# Patient Record
Sex: Female | Born: 1960 | Race: White | Hispanic: No | Marital: Single | Smoking: Former smoker
Health system: Southern US, Community
[De-identification: ages and names within clinical notes are randomized; demographics above are authoritative.]

## PROBLEM LIST (undated history)

## (undated) DIAGNOSIS — F17200 Nicotine dependence, unspecified, uncomplicated: Secondary | ICD-10-CM

## (undated) DIAGNOSIS — N83202 Unspecified ovarian cyst, left side: Secondary | ICD-10-CM

## (undated) DIAGNOSIS — D071 Carcinoma in situ of vulva: Secondary | ICD-10-CM

## (undated) HISTORY — DX: Nicotine dependence, unspecified, uncomplicated: F17.200

## (undated) HISTORY — DX: Unspecified ovarian cyst, left side: N83.202

## (undated) HISTORY — DX: Carcinoma in situ of vulva: D07.1

---
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
## 2005-10-08 ENCOUNTER — Encounter: Payer: Self-pay | Admitting: Family Medicine

## 2005-10-08 LAB — CONVERTED CEMR LAB: Pap Smear: NORMAL

## 2007-02-21 ENCOUNTER — Ambulatory Visit: Payer: Self-pay | Admitting: Family Medicine

## 2007-02-21 DIAGNOSIS — F17200 Nicotine dependence, unspecified, uncomplicated: Secondary | ICD-10-CM | POA: Insufficient documentation

## 2007-02-28 ENCOUNTER — Ambulatory Visit: Payer: Self-pay | Admitting: Family Medicine

## 2007-02-28 DIAGNOSIS — N3000 Acute cystitis without hematuria: Secondary | ICD-10-CM | POA: Insufficient documentation

## 2007-02-28 LAB — CONVERTED CEMR LAB
Glucose, Urine, Semiquant: NEGATIVE
Nitrite: POSITIVE
Protein, U semiquant: 30
Specific Gravity, Urine: 1.025
pH: 6.5

## 2007-03-07 ENCOUNTER — Encounter: Payer: Self-pay | Admitting: Family Medicine

## 2007-03-07 ENCOUNTER — Telehealth: Payer: Self-pay | Admitting: Family Medicine

## 2007-03-07 LAB — CONVERTED CEMR LAB
Bilirubin Urine: NEGATIVE
Glucose, Urine, Semiquant: NEGATIVE
Nitrite: NEGATIVE
Protein, U semiquant: 100
Specific Gravity, Urine: 1.025
Urobilinogen, UA: NEGATIVE
pH: 5.5

## 2007-05-16 ENCOUNTER — Ambulatory Visit: Payer: Self-pay | Admitting: Family Medicine

## 2007-08-02 ENCOUNTER — Ambulatory Visit: Payer: Self-pay | Admitting: Family Medicine

## 2007-09-22 HISTORY — PX: TUBAL LIGATION: SHX77

## 2007-10-04 ENCOUNTER — Ambulatory Visit: Payer: Self-pay | Admitting: Family Medicine

## 2007-10-04 LAB — CONVERTED CEMR LAB
Bilirubin Urine: NEGATIVE
Nitrite: NEGATIVE
Protein, U semiquant: NEGATIVE
Urobilinogen, UA: 0.2

## 2007-10-18 ENCOUNTER — Ambulatory Visit: Payer: Self-pay | Admitting: Family Medicine

## 2007-10-19 ENCOUNTER — Encounter: Payer: Self-pay | Admitting: Family Medicine

## 2008-01-13 ENCOUNTER — Ambulatory Visit: Payer: Self-pay | Admitting: Family Medicine

## 2008-03-30 ENCOUNTER — Encounter: Payer: Self-pay | Admitting: Family Medicine

## 2008-03-30 ENCOUNTER — Ambulatory Visit: Payer: Self-pay | Admitting: Family Medicine

## 2008-03-30 ENCOUNTER — Other Ambulatory Visit: Admission: RE | Admit: 2008-03-30 | Discharge: 2008-03-30 | Payer: Self-pay | Admitting: Family Medicine

## 2008-03-30 DIAGNOSIS — R5383 Other fatigue: Secondary | ICD-10-CM

## 2008-03-30 DIAGNOSIS — R5381 Other malaise: Secondary | ICD-10-CM

## 2008-04-18 ENCOUNTER — Ambulatory Visit: Payer: Self-pay | Admitting: Obstetrics & Gynecology

## 2008-05-02 ENCOUNTER — Encounter: Payer: Self-pay | Admitting: Family Medicine

## 2008-05-03 LAB — CONVERTED CEMR LAB
Albumin: 4.2 g/dL (ref 3.5–5.2)
Calcium: 9.1 mg/dL (ref 8.4–10.5)
Cholesterol, target level: 200 mg/dL
Cholesterol: 245 mg/dL — ABNORMAL HIGH (ref 0–200)
Creatinine, Ser: 0.71 mg/dL (ref 0.40–1.20)
HDL goal, serum: 40 mg/dL
HDL: 32 mg/dL — ABNORMAL LOW (ref >39)
LDL Cholesterol: 156 mg/dL — ABNORMAL HIGH (ref 0–99)
LDL Goal: 130 mg/dL
Potassium: 4 meq/L (ref 3.5–5.3)
TSH: 1.374 microintl units/mL (ref 0.350–4.50)
Total CHOL/HDL Ratio: 7.7
Triglycerides: 285 mg/dL — ABNORMAL HIGH (ref <150)
VLDL: 57 mg/dL — ABNORMAL HIGH (ref 0–40)

## 2008-05-25 ENCOUNTER — Ambulatory Visit: Payer: Self-pay | Admitting: Obstetrics & Gynecology

## 2008-05-25 ENCOUNTER — Ambulatory Visit (HOSPITAL_COMMUNITY): Admission: RE | Admit: 2008-05-25 | Discharge: 2008-05-25 | Payer: Self-pay | Admitting: Obstetrics & Gynecology

## 2008-05-30 ENCOUNTER — Ambulatory Visit: Payer: Self-pay | Admitting: Family Medicine

## 2008-05-30 DIAGNOSIS — E119 Type 2 diabetes mellitus without complications: Secondary | ICD-10-CM | POA: Insufficient documentation

## 2008-05-30 DIAGNOSIS — E785 Hyperlipidemia, unspecified: Secondary | ICD-10-CM

## 2008-05-30 DIAGNOSIS — R7301 Impaired fasting glucose: Secondary | ICD-10-CM

## 2008-06-06 ENCOUNTER — Ambulatory Visit: Payer: Self-pay | Admitting: Obstetrics & Gynecology

## 2008-06-27 ENCOUNTER — Ambulatory Visit: Payer: Self-pay | Admitting: Obstetrics & Gynecology

## 2008-08-14 ENCOUNTER — Telehealth (INDEPENDENT_AMBULATORY_CARE_PROVIDER_SITE_OTHER): Payer: Self-pay | Admitting: *Deleted

## 2010-05-15 ENCOUNTER — Ambulatory Visit: Payer: Self-pay | Admitting: Family Medicine

## 2010-05-15 ENCOUNTER — Encounter: Admission: RE | Admit: 2010-05-15 | Discharge: 2010-05-15 | Payer: Self-pay | Admitting: Family Medicine

## 2010-05-15 DIAGNOSIS — R1032 Left lower quadrant pain: Secondary | ICD-10-CM

## 2010-05-15 LAB — CONVERTED CEMR LAB
Bilirubin Urine: NEGATIVE
Specific Gravity, Urine: 1.02
Urobilinogen, UA: 0.2

## 2010-05-21 ENCOUNTER — Ambulatory Visit: Payer: Self-pay | Admitting: Family Medicine

## 2010-05-21 DIAGNOSIS — N83209 Unspecified ovarian cyst, unspecified side: Secondary | ICD-10-CM

## 2010-10-13 ENCOUNTER — Encounter: Payer: Self-pay | Admitting: Obstetrics & Gynecology

## 2010-10-21 NOTE — Assessment & Plan Note (Signed)
Summary: f/u ovarian cyst   Vital Signs:  Patient profile:   50 year old female Height:      62 inches Weight:      128 pounds BMI:     23.50 O2 Sat:      97 % on Room air Temp:     98.9 degrees F oral Pulse rate:   97 / minute BP sitting:   129 / 82  (left arm) Cuff size:   large  Vitals Entered By: Payton Spark CMA (May 21, 2010 3:43 PM)  O2 Flow:  Room air CC: 1 week f/u. Feeling much better.   Primary Care Provider:  Seymour Bars D.O.  CC:  1 week f/u. Feeling much better.Marland Kitchen  History of Present Illness: 50 yo WF presents f/u LLQ pain which turned out to be an ovarian cyst on a CT scan of the abdomen and pelvis.  She is feeling much better.  She is having regular periods.  She is mid cycle now.  She is not taking anything OTC.  Bowels normal.  No problems voiding.  Current Medications (verified): 1)  Promethazine-Codeine 6.25-10 Mg/78ml Syrp (Promethazine-Codeine) .... 5-10 Ml By Mouth Q 6 Hrs As Needed Nausea  Allergies (verified): 1)  ! Pcn 2)  ! * Td 3)  ! Darvocet  Past History:  Past Medical History: smoking L ovarian cyst 8-11  Past Surgical History: Reviewed history from 05/15/2010 and no changes required. BTL 2009  Social History: Reviewed history from 02/21/2007 and no changes required. 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  Lives with boyfriend and stepson.  No kids. G1P0. Smoked 1 ppd x 25 yrs, trying to quit.  Eats healthy and exercises regularly. Occasional ETOH.  Review of Systems      See HPI  Physical Exam  General:  alert, well-developed, well-nourished, and well-hydrated.   Head:  normocephalic and atraumatic.   Lungs:  Normal respiratory effort, chest expands symmetrically. Lungs are clear to auscultation, no crackles or wheezes. Heart:  Normal rate and regular rhythm. S1 and S2 normal without gallop, murmur, click, rub or other extra sounds. Abdomen:  Bowel sounds positive,abdomen soft and non-tender without masses, organomegaly    Skin:  color normal.     Impression & Recommendations:  Problem # 1:  OVARIAN CYST, LEFT (ICD-620.2) Assessment Improved LLQ pain confirmed to be a L resolving ovarian cyst on CT scan (o/w neg).   Pain has resolved completely and she is doing great. On schedule for her first colonoscopy at 51.  Complete Medication List: 1)  Promethazine-codeine 6.25-10 Mg/84ml Syrp (Promethazine-codeine) .... 5-10 ml by mouth q 6 hrs as needed nausea

## 2010-10-21 NOTE — Assessment & Plan Note (Signed)
Summary: LLQ pain   Vital Signs:  Patient profile:   50 year old female Height:      62 inches Weight:      126 pounds BMI:     23.13 O2 Sat:      98 % on Room air Temp:     98.9 degrees F oral Pulse rate:   93 / minute BP sitting:   127 / 81  (left arm) Cuff size:   large  Vitals Entered By: Payton Spark CMA (May 15, 2010 8:13 AM)  O2 Flow:  Room air CC: LLQ pain radiates around and to lower back.    Primary Care Provider:  Seymour Bars D.O.  CC:  LLQ pain radiates around and to lower back. .  History of Present Illness: 50 yo WF presents for LLQ pain that began at 0200 Sunday AM.  The pain started periumbilically and also a sharp pain in the LLQ.  It was constant.  She took some Tylenol.  She had rode her horse the day prior.  It did not improve after Tylenol.  She had normal BMs w/ o bleeding.  She has had a little less appetite.  she had nausea last night.  No v/d.  Had chills on and off.    She had a BTL 2 yrs ago.  She is having regular periods.     Current Medications (verified): 1)  None  Allergies (verified): 1)  ! Pcn 2)  ! * Td 3)  ! Darvocet  Past History:  Past Medical History: Reviewed history from 02/21/2007 and no changes required. smoking  Past Surgical History: BTL 2009  Family History: Reviewed history from 03/30/2008 and no changes required. father AMI at 61, HTN, high cholestrol mother healthy brother high chol, hypothyroid GF colorectal CA at 4.  Social History: Reviewed history from 02/21/2007 and no changes required. 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  No kids. G1P0. Smoked 1 ppd x 25 yrs, trying to quit.  Eats healthy and exercises regularly. Occasional ETOH.  Review of Systems      See HPI  Physical Exam  General:  alert, well-developed, well-nourished, and well-hydrated.   Head:  normocephalic and atraumatic.   Eyes:  sclera non icteric Mouth:  pharynx pink and moist.   Neck:   no masses.   Lungs:  Normal respiratory effort, chest expands symmetrically. Lungs are clear to auscultation, no crackles or wheezes. Heart:  Normal rate and regular rhythm. S1 and S2 normal without gallop, murmur, click, rub or other extra sounds. Abdomen:  LLQ TTP with vol guarding.  soft.  ND.  NABS.  No HSM Extremities:  no LE edema Skin:  color normal.  no pallor or jaundice Cervical Nodes:  No lymphadenopathy noted   Impression & Recommendations:  Problem # 1:  ABDOMINAL PAIN, LEFT LOWER QUADRANT (ICD-789.04) UA + for blood but neg for sign of infection with LLQ pain.  DDX includes diverticulitis vs nephrolithiasis vs ovarian cyst.  Not high risk for adhesions given only abd surgery :BTL.  Unlikely tubal pregnancy given BTL hx.   Will get CT abd/ pelvis today with labs to look for cause.  No sign of actue abdomen.  Hemodynamically stable.   Treat pain/ nausea with RX phentergan with codeine.  Hold off on filling abx until CT confirms if this is truly diverticulitis.   Orders: T-CBC w/Diff ([id number]) T-Comprehensive Metabolic Panel [phone]) T-CT Abdomen/pelvis w ([id number]) UA Dipstick w/o Micro (  automated)  (81003)  Complete Medication List: 1)  Metronidazole 500 Mg Tabs (Metronidazole) .Marland Kitchen.. 1 tab by mouth three times a day x 10 days 2)  Ciprofloxacin Hcl 500 Mg Tabs (Ciprofloxacin hcl) .Marland Kitchen.. 1 tab by mouth two times a day x 10 days 3)  Promethazine-codeine 6.25-10 Mg/5ml Syrp (Promethazine-codeine) .... 5-10 ml by mouth q 6 hrs as needed nausea  Patient Instructions: 1)  Labs first today then go down the hall to imaging for CT scan. 2)  Will call you w/ results this afternoon. 3)  Clear liquid diet x 48 hrs. 4)  Use Promethazine with Codeine for nausea/ abdominal pain as needed.   5)  Return for follow up on WED. Prescriptions: PROMETHAZINE-CODEINE 6.25-10 MG/5ML SYRP (PROMETHAZINE-CODEINE) 5-10 ml by mouth q 6 hrs as needed nausea  #120 ml x 0   Entered and Authorized by:    Seymour Bars DO   Signed by:   Seymour Bars DO on 05/15/2010   Method used:   Printed then faxed to ...       [facility]* (retail)       [address] [place], [place]  [address]       Ph: [phone]       Fax: [phone]   RxID:   [id number] CIPROFLOXACIN HCL 500 MG TABS (CIPROFLOXACIN HCL) 1 tab by mouth two times a day x 10 days  #20 x 0   Entered and Authorized by:   Seymour Bars DO   Signed by:   Seymour Bars DO on 05/15/2010   Method used:   Print then Give to Patient   RxID:   [id number] METRONIDAZOLE 500 MG TABS (METRONIDAZOLE) 1 tab by mouth three times a day x 10 days  #30 x 0   Entered and Authorized by:   Seymour Bars DO   Signed by:   Seymour Bars DO on 05/15/2010   Method used:   Print then Give to Patient   RxID:   [id number]   Laboratory Results   Urine Tests    Routine Urinalysis   Color: lt. yellow Appearance: Clear Glucose: negative   (Normal Range: Negative) Bilirubin: negative   (Normal Range: Negative) Ketone: negative   (Normal Range: Negative) Spec. Gravity: 1.020   (Normal Range: 1.003-1.035) Blood: large   (Normal Range: Negative) pH: 7.0   (Normal Range: 5.0-8.0) Protein: negative   (Normal Range: Negative) Urobilinogen: 0.2   (Normal Range: 0-1) Nitrite: negative   (Normal Range: Negative) Leukocyte Esterace: trace   (Normal Range: Negative)

## 2011-02-03 NOTE — Assessment & Plan Note (Signed)
NAMEARLETT, GOOLD                 ACCOUNT NO.:  [id number]   MEDICAL RECORD NO.:  [id number]          PATIENT TYPE:  POB   LOCATION:  [facility] [place]         FACILITY:  [facility]   PHYSICIAN:  Allie Bossier, MD        DATE OF BIRTH:  January 13, 1961   DATE OF SERVICE:                                  CLINIC NOTE   Kelli Frank is a 50 year old single white gravida 2, para 0, elective AB 2,  who is helping raise her boyfriend's kids.  In fact, he has a 14-year-  old son and a 31 year old son.  The 31 year old son lives with Kelli Frank and  her boyfriend.  They have been monogamous for 5 years.  She is certain  that she does not want children ever.  She used oral contraceptive pills  in her 33s, then she was abstinent for another 10 plus years, and she  has been on Depo for the last 3 years.  She is amenorrheic, but she has  had a 20-pound weight gain in the last 2 years and is concerned about  the risks of osteoporosis.  She wishes to have permanent sterility in  the form of the tubal ligation.  We discussed the Essure procedure, but  due to the followup HSC that is necessary, she opts for the tubal  ligation.   PAST MEDICAL HISTORY:  None.   PAST SURGICAL HISTORY:  Tonsillectomy and adenoidectomy as a child.   FAMILY HISTORY:  Negative for breast, GYN, and colon malignancies.   SOCIAL HISTORY:  She is currently on Chantix, trying to stop smoking,  but she has a 25-pack year history of smoking.  She drinks alcohol very  rarely and denies any drug use.   ALLERGIES:  No Latex allergies.  Medicine allergy is TETANUS,  PENICILLIN, and DARVOCET.   REVIEW OF SYSTEMS:  She had a Pap smear in July that was normal.  Her  mammogram is scheduled.  She is self-scheduled to get her TSH drawn  along with routine blood work from her family doctor.  She currently  denies dyspareunia.  She is a Oceanographer.  She had occasional hot  flashes and night sweats over the last 2 years.   MEDICATIONS:  1. Chantix  daily.  2. Vitamin E 400 units daily.  3. Vitamin C 1000 mg daily.  4. Fish oil 1000 mg daily.  5. Glucosamine 50 mg daily.  6. Depo-Provera 150 mg IM every 12 weeks.  Her last shot was March 30, 2008.  She takes calcium with vitamin D daily, and she takes      ibuprofen as necessary.   PHYSICAL EXAMINATION:  VITAL SIGNS:  Her weight is 144 pounds, her  height is 5 foot 2, blood pressure 109/76, and pulse 82.  HEENT:  Normal.  HEART:  Regular rate and rhythm.  LUNGS:  Clear to auscultation bilaterally.  PELVIC:  Within normal limits.   ASSESSMENT/PLAN:  Desires sterility.  We have discussed the various  alternatives for sterility, and she chooses tubal ligation (laparoscopic  Filshie clips).  I have explained 1% failure rate, as well as the  recovery time, and risks of surgery.  She wishes to proceed.      Allie Bossier, MD     MCD/MEDQ  D:  04/18/2008  T:  04/19/2008  Job:  [id number]

## 2011-02-03 NOTE — Op Note (Signed)
Kelli Frank, Kelli Frank                 ACCOUNT NO.:  [id number]   MEDICAL RECORD NO.:  [id number]          PATIENT TYPE:  AMB   LOCATION:  [facility]                           FACILITY:  [facility]   PHYSICIAN:  Allie Bossier, MD        DATE OF BIRTH:  Aug 29, 1961   DATE OF PROCEDURE:  DATE OF DISCHARGE:                               OPERATIVE REPORT   PREOPERATIVE DIAGNOSIS:  Desires sterility.   POSTOPERATIVE DIAGNOSES:  1. Desires sterility.  2. Adhesions.   PROCEDURE:  1. Laparoscopic application of Filshie clip.  2. Lysis of adhesions.   SURGEON:  Allie Bossier, MD   ANESTHESIA:  General anesthesia.   COMPLICATIONS:  None.   ESTIMATED BLOOD LOSS:  Minimal.   SPECIMENS:  None.   FINDINGS:  1. Dense adhesions of the omentum to the right anterior abdominal      wall.  2. Otherwise normal pelvic anatomy.   DETAILED PROCEDURE AND FINDINGS:  The risks, benefits, and alternatives  of the surgery were explained.  The risks were accepted.  Consents were  signed.  She understands 1% failure rate and declines alternative forms  of birth control.  She was taken to the operating room, placed in a  dorsal lithotomy position.  Once she was comfortable, gentle anesthesia  was applied without complication.  Her abdomen and vagina were prepped  and draped in a usual sterile fashion.  Her bladder was emptied with a  Robinson catheter.  Bimanual exam confirmed normal pelvic anatomy.  A  Hulka manipulator was placed.  Gloves were changed.  Attention was  turned to the abdomen.  A vertical umbilical incision was made.  A  Veress needle was placed intraperitoneally.  Low-flow CO2 was used to  insufflate the abdomen approximately 2 L.  The patient's abdominal  pressure was always less than 10.  A 12-mm trocar was placed.  Laparoscopy confirmed correct placement.  She was placed in  Trendelenburg position.  There were dense adhesions of the omentum to  the right anterior abdominal wall.  The oviducts were  visually traced in  the fimbriated end.  In the isthmic region of each tube, each oviduct  was traced to its fimbriated end.  A Filshie clip was placed in the  isthmic region of each tube across the entire oviduct in a perpendicular  fashion.  No bleeding was noted from these sites.  Because of the dense  adhesions, I put a 5-mm port in each lower quadrant using  electrosurgical technique down the adhesions.  No bleeding was noted.  Upon initial placement of the first Filshie clip on the patient's right  tube, the clip was not engaged properly and fell off.  I put in a 5-mm  port in each lower quadrant and retrieved the Filshie clip after both  tubes were properly clamped.  I then took on the adhesions with  electrosurgical technique.  Excellent hemostasis was maintained.  The 5-  mm ports were removed.  No bleeding was noted from the site.  The CO2  was allowed to escape from  the abdomen.  The 12-mm port was removed.  A  total of 10 mL of 0.5% Marcaine was injected in subcutaneous tissue with  the 3 incision sites.  A subcuticular closure was done with 4-0 Vicryl  at each site.  The Hulka manipulator was removed.  She was extubated and  taken to recovery room in stable condition.  Instrument, sponge, and  needles were counts.      Allie Bossier, MD  Electronically Signed     MCD/MEDQ  D:  05/25/2008  T:  05/26/2008  Job:  [id number]

## 2011-02-03 NOTE — Assessment & Plan Note (Signed)
Kelli Frank, Kelli Frank                 ACCOUNT NO.:  [id number]   MEDICAL RECORD NO.:  [id number]          PATIENT TYPE:  POB   LOCATION:  [facility] [place]         FACILITY:  [facility]   PHYSICIAN:  Elsie Lincoln, MD      DATE OF BIRTH:  01-16-1961   DATE OF SERVICE:                                  CLINIC NOTE   The patient is a 50 year old female who underwent laparoscopic tubal  ligation on May 25, 2008.  The patient had an uncomplicated  surgery.  There were some omental adhesions to anterior abdominal wall  that were resected down without issue.  There was no other pelvic  adhesive disease.  I did call Dr. Marice Potter today and confirmed that there  was nothing else that I should be aware of this woman's operative  report.  The patient reports severe pain in her lower quadrants near her  ports.  She complains of some decreased appetite.  No nausea, vomiting,  or diarrhea.  The pain is sometimes so severe that it keeps her from  walking, and this pain has only been there since this past Sunday.   PHYSICAL EXAMINATION:  VITAL SIGNS:  Temperature 97.6, pulse 94, blood  pressure 144/87, weight 144, and height 62 inches.  GENERAL:  Well nourished, well developed, in no apparent distress.  ABDOMEN:  Soft, tender over right and left lower quadrants at the  incisions, no rebound, and little bit of guarding.  BIMANUAL:  Positive cervical motion tenderness.   ASSESSMENT AND PLAN:  A 50 year old female 2 weeks' postop from a  bilateral tubal ligation with severe lower quadrant pain.  We will get;  1. CT with IV and p.o. contrast of the abdomen and pelvis to rule out      appendicitis or any postoperative complications.  2. Come back in 2 weeks to follow up pain.  3. If the CT shows pathology, we will call her and send to the      emergency room.           ______________________________  Elsie Lincoln, MD     KL/MEDQ  D:  06/06/2008  T:  06/07/2008  Job:  [id number]

## 2011-02-03 NOTE — Assessment & Plan Note (Signed)
Kelli Frank, Kelli Frank                 ACCOUNT NO.:  [id number]   MEDICAL RECORD NO.:  [id number]          PATIENT TYPE:  POB   LOCATION:  [facility] [place]         FACILITY:  [facility]   PHYSICIAN:  Allie Bossier, MD        DATE OF BIRTH:  Oct 13, 1960   DATE OF SERVICE:  06/27/2008                                  CLINIC NOTE   Ms. Ford is a 50 year old lady who is 6 weeks' postop status post  laparoscopic application of plastic cups for sterilization procedure and  lysis of adhesions from her right anterior abdominal wall (omental  adhesions).  Lerline had a somewhat difficult postop course.  She had some  right lower quadrant pain and in fact was seen here on June 06, 2008.  A pelvic exam was done, and a CT with IV and p.o. contrast of the  abdomen and pelvis was done to rule out appendicitis.  However, Desi's  pain resolved very rapidly after her exam, and she did not get her CT.  She has had no further problems.  She has had a small amount of bleeding  (as her Depo-Provera wears off).  Normal appetite, no bowel changes, and  no incisional problems.   PHYSICAL EXAMINATION:  SKIN:  Her incisions are all healed well.   ASSESSMENT AND PLAN:  Postoperative check, doing well.  Follow up in  July 2010 for annual exam.      Allie Bossier, MD     MCD/MEDQ  D:  06/27/2008  T:  06/27/2008  Job:  [phone]

## 2011-04-10 ENCOUNTER — Other Ambulatory Visit: Payer: Self-pay | Admitting: Family Medicine

## 2011-04-10 ENCOUNTER — Ambulatory Visit (INDEPENDENT_AMBULATORY_CARE_PROVIDER_SITE_OTHER): Payer: Self-pay | Admitting: Family Medicine

## 2011-04-10 ENCOUNTER — Ambulatory Visit: Payer: Self-pay | Admitting: Family Medicine

## 2011-04-10 ENCOUNTER — Telehealth: Payer: Self-pay | Admitting: Family Medicine

## 2011-04-10 VITALS — BP 137/88 | HR 82 | Temp 98.9°F

## 2011-04-10 DIAGNOSIS — N390 Urinary tract infection, site not specified: Secondary | ICD-10-CM

## 2011-04-10 LAB — POCT URINALYSIS DIPSTICK
Bilirubin, UA: NEGATIVE
Glucose, UA: NEGATIVE
Protein, UA: NEGATIVE
Spec Grav, UA: 1.02

## 2011-04-10 MED ORDER — CIPROFLOXACIN HCL 250 MG PO TABS: 250.0000 mg | ORAL_TABLET | Freq: Two times a day (BID) | ORAL | Status: DC

## 2011-04-10 NOTE — Telephone Encounter (Signed)
Pt aware of above.

## 2011-04-10 NOTE — Progress Notes (Signed)
  Subjective:    Patient ID: Kelli Frank, female    DOB: July 07, 1961, 50 y.o.   MRN: [id number]  HPI  Dysuria x 2 days.  Review of Systems     Objective:   Physical Exam        Assessment & Plan:  UA + for infection.  Will treat with Cipro 250 bid x 3 days.  Call if not resolved in 72 hrs.

## 2011-04-10 NOTE — Telephone Encounter (Signed)
Pls let pt know that her UA did look + for infection.  I sent her in Cipro to take 2 x a day for 3 days.  Avoid sex during treatment.  Drink plenty of water.  Call if symptoms have not resolved in 72 hrs.

## 2011-04-10 NOTE — Progress Notes (Signed)
  Subjective:    Patient ID: Kelli Frank, female    DOB: 03-16-1961, 50 y.o.   MRN: [id number]  HPI  Dysuria x 2 days with pressure.   Review of Systems     Objective:   Physical Exam        Assessment & Plan:  Dysuria- UA + for blood, trace LE.  Not menstruating.  Will start her on Cipro today.  Call if not improved in 3 days.

## 2011-04-19 ENCOUNTER — Encounter: Payer: Self-pay | Admitting: Family Medicine

## 2011-05-01 ENCOUNTER — Other Ambulatory Visit (HOSPITAL_COMMUNITY)
Admission: RE | Admit: 2011-05-01 | Discharge: 2011-05-01 | Disposition: A | Discharge status: Home / Self Care | Payer: BC Managed Care – PPO | Source: Ambulatory Visit | Attending: Family Medicine | Admitting: Family Medicine

## 2011-05-01 ENCOUNTER — Ambulatory Visit (INDEPENDENT_AMBULATORY_CARE_PROVIDER_SITE_OTHER): Payer: BC Managed Care – PPO | Admitting: Family Medicine

## 2011-05-01 ENCOUNTER — Encounter: Payer: Self-pay | Admitting: Family Medicine

## 2011-05-01 VITALS — BP 147/88 | HR 97 | Ht 61.0 in | Wt 127.0 lb

## 2011-05-01 DIAGNOSIS — Z130 Encounter for screening for diseases of the blood and blood-forming organs and certain disorders involving the immune mechanism: Secondary | ICD-10-CM

## 2011-05-01 DIAGNOSIS — Z1329 Encounter for screening for other suspected endocrine disorder: Secondary | ICD-10-CM

## 2011-05-01 DIAGNOSIS — Z13220 Encounter for screening for lipoid disorders: Secondary | ICD-10-CM

## 2011-05-01 DIAGNOSIS — Z01419 Encounter for gynecological examination (general) (routine) without abnormal findings: Secondary | ICD-10-CM | POA: Insufficient documentation

## 2011-05-01 DIAGNOSIS — Z1231 Encounter for screening mammogram for malignant neoplasm of breast: Secondary | ICD-10-CM

## 2011-05-01 MED ORDER — VARENICLINE TARTRATE 0.5 MG X 11 & 1 MG X 42 PO MISC: ORAL | Status: DC

## 2011-05-01 NOTE — Progress Notes (Signed)
  Subjective:    Patient ID: Kelli Frank, female    DOB: 09/06/1961, 50 y.o.   MRN: [id number]  HPI 50 yo WF presents for CPE with pap.  She is perimenopausal.  Ready to quit smoking. Wants to retry Chantix.  Monogamous relationship.  No vag discharge.  Not on birth control.  Has noticed a purplish white skin discoloration over the L perineum for about a month.  She thought it was from sex or riding her horse so she stopped both for 3 wks but it only grew in size.  No drainage and no pain.  Due for fasting labs and a mammogram.  BP 147/88  Pulse 97  Ht 5\' 1"  (1.549 m)  Wt 127 lb (57.607 kg)  BMI 24.00 kg/m2  SpO2 99%  LMP 04/20/2011   Review of Systems    Gen: no fevers, chills, hot flashes, night sweats, change in weight GI: no N/V/C/D GU: no dysuria, incontinence or sexual dysfunction CV: no chest pain, DOE, palpitations s or edema Pulm:  Denies CP, SOB or chronic cough  Objective:   Physical Exam  HENT:  Mouth/Throat: Oropharynx is clear and moist.  Eyes: No scleral icterus.  Neck: Neck supple. No thyromegaly present.  Cardiovascular: Normal rate, regular rhythm and normal heart sounds.   No murmur heard. Pulmonary/Chest: Effort normal and breath sounds normal. No respiratory distress. She has no wheezes. Right breast exhibits no mass, no nipple discharge, no skin change and no tenderness. Left breast exhibits no mass, no nipple discharge, no skin change and no tenderness.  Abdominal: Soft. She exhibits no distension. There is tenderness. There is no guarding.  Genitourinary: Uterus normal. Cervix exhibits no motion tenderness and no discharge. Right adnexum displays no mass, no tenderness and no fullness. Left adnexum displays no mass, no tenderness and no fullness. No erythema or tenderness around the vagina. Vaginal discharge found.  Musculoskeletal: She exhibits no edema.  Skin: Skin is warm and dry.       Purplish colored plaque - like lesion ~2 cm over the L perineal skin.    Psychiatric: She has a normal mood and affect.    Gen: alert, well groomed in NAD Neck: no thyromegaly or cervical lymphadenopathy CV: RRR w/o murmur, no audible carotid bruits or abdominal aortic bruits Ext: no edema, clubbing or cyanosis Lungs: CTA bilat w/o W/R/R; nonlabored HEENT:  [place]/AT; PERRLA; oropharynx pink and moist with good dentition Abd: soft, NT, ND, NABS, No HSM, no audible AA bruits Skin: warm and dry; no rash, pallor or jaundice Psych: does not appear anxious or depressed; answers questions appropriately      Assessment & Plan:  Assesment:  1. CPE- Keeping healthy checklist for women reviewed today.  BP elevated for the first time today.    BMI 24= normal. Labs ordered Colonoscopy due at 51. Last pap/- today Mammogram- ordered.  Allergic to tetanus. Encouraged healthy diet, regular exercise, MVI daily. Return for next physical in 1 yr.   Refer to gyn urgently to biopsy this purplish colored skin plaque on perineum worrisome for SCC.

## 2011-05-01 NOTE — Patient Instructions (Signed)
Update fasting labs one  Morning downstairs.  Will call you w/ results and with pap results.  Will set up biopsy of skin lesion.  Call [phone] and ask for [facility] to set up mammogram downstairs.  Chantix RX sent.  Return for f/u as needed.

## 2011-05-06 ENCOUNTER — Encounter: Payer: Self-pay | Admitting: *Deleted

## 2011-05-07 ENCOUNTER — Other Ambulatory Visit: Payer: Self-pay | Admitting: *Deleted

## 2011-05-07 MED ORDER — FESOTERODINE FUMARATE ER 4 MG PO TB24: 4.0000 mg | ORAL_TABLET | Freq: Every day | ORAL | Status: DC

## 2011-05-08 ENCOUNTER — Telehealth: Payer: Self-pay | Admitting: Family Medicine

## 2011-05-08 NOTE — Telephone Encounter (Signed)
Pls let pt know that her pap smear came back normal.

## 2011-05-08 NOTE — Telephone Encounter (Signed)
Pt aware.

## 2011-05-11 ENCOUNTER — Ambulatory Visit (INDEPENDENT_AMBULATORY_CARE_PROVIDER_SITE_OTHER): Payer: BC Managed Care – PPO | Admitting: Physician Assistant

## 2011-05-11 VITALS — BP 126/81 | HR 91 | Resp 16 | Ht 61.0 in | Wt 130.0 lb

## 2011-05-11 DIAGNOSIS — N898 Other specified noninflammatory disorders of vagina: Secondary | ICD-10-CM

## 2011-05-11 MED ORDER — LIDOCAINE 5 % EX OINT: TOPICAL_OINTMENT | CUTANEOUS | Status: DC | PRN

## 2011-05-12 ENCOUNTER — Ambulatory Visit
Admission: RE | Admit: 2011-05-12 | Discharge: 2011-05-12 | Disposition: A | Discharge status: Home / Self Care | Payer: BC Managed Care – PPO | Source: Ambulatory Visit | Attending: Family Medicine | Admitting: Family Medicine

## 2011-05-12 ENCOUNTER — Telehealth: Payer: Self-pay | Admitting: Family Medicine

## 2011-05-12 DIAGNOSIS — Z1231 Encounter for screening mammogram for malignant neoplasm of breast: Secondary | ICD-10-CM

## 2011-05-12 LAB — COMPLETE METABOLIC PANEL WITH GFR
ALT: <8 U/L (ref 0–35)
Albumin: 4.2 g/dL (ref 3.5–5.2)
Alkaline Phosphatase: 82 U/L (ref 39–117)
CO2: 25 mEq/L (ref 19–32)
Chloride: 108 mEq/L (ref 96–112)
GFR, Est African American: >60 mL/min (ref >60)
Glucose, Bld: 88 mg/dL (ref 70–99)
Potassium: 4.4 mEq/L (ref 3.5–5.3)
Sodium: 143 mEq/L (ref 135–145)
Total Bilirubin: 0.3 mg/dL (ref 0.3–1.2)
Total Protein: 6.5 g/dL (ref 6.0–8.3)

## 2011-05-12 LAB — LIPID PANEL
HDL: 44 mg/dL (ref >39)
VLDL: 22 mg/dL (ref 0–40)

## 2011-05-12 NOTE — Progress Notes (Signed)
  Subjective:    Patient ID: Kelli Frank, female    DOB: Jan 27, 1961, 50 y.o.   MRN: [id number]  HPI  Pt is a 50yo WF who presents with report of vulvar lesion first noted by husband ~ 3months ago. States that the original lesion was <1cm in size and noted to the left of the introitus and tan in color. Reports over the last 3 months the lesion has increased in size and the color has changed to purple/black/bruised color. Denies irritation or discomfort with ADLs or intercourse.  Review of Systems Neg other than what has been reviewed in the HPI    Objective:   Physical Exam  BP 126/81  Pulse 91  Resp 16  Ht 5\' 1"  (1.549 m)  Wt 130 lb (58.968 kg)  BMI 24.56 kg/m2  LMP 04/22/2011 Ple[place] WF, NAD Genitalia: irregular shaped lesion noted at 4 o'clock at the introitus, lateral lesion slightly raised and white in color, distal portion noted to be flat, purplish in color with irregular borders.  Procedure: Informed consent received and timeout preformed the area was infused with 1.5cc of 1% lidocaine. After approriate anesthesic block was established, 2 punch bx were performed in normal fashion in the above described areas in the distal and lateral lesion. Specimens were obtained and placed in formulin, sent to pathology.     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

## 2011-05-12 NOTE — Telephone Encounter (Signed)
Pls let pt know that her fasting sugar, liver and kidney function came back normal.  Her cholesterol is still a bit high with an LDL bad chol of 158 (unchanged from last year).  If she continues to smoke, we definitely need to get her to <130.  If she is unable to quit in the next 2 mos, will go ahead and start her on medication.  Let me know.

## 2011-05-13 NOTE — Telephone Encounter (Signed)
Pt aware.

## 2011-05-14 NOTE — Progress Notes (Signed)
Addendum: Pt is scheduled with Dr. Valentino Nose 05/28/11 at 9:00 in the [facility] center [facility].  She is to see Maylon Cos on 05/18/11 to discuss test results.

## 2011-05-18 ENCOUNTER — Encounter: Payer: Self-pay | Admitting: Physician Assistant

## 2011-05-18 ENCOUNTER — Ambulatory Visit: Payer: BC Managed Care – PPO | Admitting: Physician Assistant

## 2011-05-18 VITALS — BP 137/86 | HR 97 | Resp 17 | Ht 61.0 in | Wt 130.0 lb

## 2011-05-18 DIAGNOSIS — E8881 Metabolic syndrome: Secondary | ICD-10-CM

## 2011-05-28 ENCOUNTER — Ambulatory Visit: Payer: BC Managed Care – PPO | Attending: Gynecologic Oncology | Admitting: Gynecologic Oncology

## 2011-05-28 DIAGNOSIS — Z79899 Other long term (current) drug therapy: Secondary | ICD-10-CM | POA: Insufficient documentation

## 2011-05-28 DIAGNOSIS — D071 Carcinoma in situ of vulva: Secondary | ICD-10-CM | POA: Insufficient documentation

## 2011-05-29 NOTE — Consult Note (Signed)
Kelli Frank, Kelli Frank NO.:  [id number]  MEDICAL RECORD NO.:  [id number]  LOCATION:  GYN                          FACILITY:  [facility]  PHYSICIAN:  Kelli Schimke, MD     DATE OF BIRTH:  [birth year]  DATE OF CONSULTATION:  05/28/2011 DATE OF DISCHARGE:                                CONSULTATION   REASON FOR VISIT:  Consult was requested by Dr. Elsie Frank for  management of vulvar intraepithelial neoplasia, grade 3.  HISTORY OF PRESENT ILLNESS:  This is a 50 year old gravida 2, para 0, last normal menstrual period approximately 3 weeks ago.  The patient and her partner first observed the lesion approximately 6 months ago that appeared like a bruise, it has increased in size over the interim.  She presented to Dr. Penne Frank and a biopsy was obtained of the lesion. Lesion per the patient, was in the inferior-most aspect of the genitalia.  Biopsies at left inner 1 o'clock and left inner 6 o'clock returned with VIN-3.  Kelli Frank reports pruritus in the past, but none currently.  PAST MEDICAL HISTORY:  Denies.  PAST SURGICAL HISTORY:  Tonsillectomy and adenoidectomy at age 57, bilateral tubal ligation 3 years ago.  PAST GYN HISTORY:  Menarche occurred at the age of 77 with regular menses since.  She reports fibroid present for numerous years, confirmed by ultrasound.  She also reports mid-cycle intermittent left lower quadrant pain.  She denies any history of abnormal Pap test, the last Pap on May 01, 2011, was negative for intraepithelial lesion or malignancy.  Mammogram in 2012 was within normal limits.  ALLERGIES:  PENICILLIN causes hives and swelling.  TETANUS SHOT causes hives and swelling.  DARVOCET causes hives and swelling.  MEDICATIONS: 1. Kelli Frank 4 mg daily. 2. Kelli Frank, begun May 24, 2011. 3. Kelli Frank 2000 mg p.o. daily.  SOCIAL HISTORY:  Tobacco use, one-third Frank per day since the age of 9.  Alcohol use for 4 years.  The patient  is employed as a Warden/ranger.  She has been in a stable relationship for approximately 7-1/2 years.  REVIEW OF SYSTEMS:  No nausea, vomiting, shortness of breath, cough, hemoptysis.  No adenopathy, fever, chills, abdominal pain, nausea, vomiting, diarrhea.  Denies hematocolpos, hematuria, or hematochezia. She reports stress urinary incontinence.  Otherwise 10-point review of systems is negative.  PHYSICAL EXAMINATION:  VITAL SIGNS:  Weight 126 pounds, height 5 feet 1 inch, blood pressure 116/80, pulse of 64, respiratory rate 16, temperature 98.7. CHEST:  Clear to auscultation. HEART:  Regular rate and rhythm.  Normal S1, S2. LYMPH NODES SURVEY:  No cervical, subclavicular or inguinal adenopathy. BACK:  No CVA tenderness. ABDOMEN:  Soft, nontender, non-obese. PELVIC EXAMINATION:  Normal Bartholin's, urethra, and Skene's. Approximately 4 cm lesion noted on the perineum, close proximity to the anus.  No other lesions were noted on the vulva.  Pelvic examination is notable also for approximately 8 cm posterior leiomyoma.  There is no cul-de-sac nodularity. RECTAL EXAMINATION:  Good anal sphincter tone.  Posterior uterine mass appreciated. EXTREMITIES:  No clubbing, cyanosis, or edema.  IMPRESSION:  Ms. Kelli Frank is a 50 year old with vulva intraepithelial neoplasia,  grade 3 of the perineum.  The recommendations made to the patient were that of a wide local excision of the lesion.  The risks and benefits of the procedure were discussed with her.  She has plans to attend a cruise next week.  The surgical procedure will be scheduled shortly after her return.  All the questions of her and her partner were answered to their satisfaction.     Kelli Schimke, MD     WB/MEDQ  D:  05/28/2011  T:  05/28/2011  Job:  [id number]  cc:   Kelli Frank, M.D.  Kelli Frank, R.N. [address] [place], [place] [address]  Electronically Signed by Kelli Schimke MD on 05/29/2011  10:51:59 AM

## 2011-06-19 ENCOUNTER — Encounter (HOSPITAL_COMMUNITY): Payer: BC Managed Care – PPO

## 2011-06-19 ENCOUNTER — Other Ambulatory Visit: Payer: Self-pay | Admitting: Gynecologic Oncology

## 2011-06-19 LAB — CBC
HCT: 36.5 % (ref 36.0–46.0)
MCH: 31.1 pg (ref 26.0–34.0)
MCV: 88.8 fL (ref 78.0–100.0)
RBC: 4.11 MIL/uL (ref 3.87–5.11)

## 2011-06-19 LAB — SURGICAL PCR SCREEN
MRSA, PCR: NEGATIVE
Staphylococcus aureus: NEGATIVE

## 2011-06-22 ENCOUNTER — Other Ambulatory Visit: Payer: Self-pay | Admitting: Family Medicine

## 2011-06-22 MED ORDER — VARENICLINE TARTRATE 1 MG PO TABS: 1.0000 mg | ORAL_TABLET | Freq: Two times a day (BID) | ORAL | Status: DC

## 2011-06-22 NOTE — Telephone Encounter (Signed)
Pt called for refill of her chantix.  Pt currently on 1 mg PO BID.  Has been on this dose for 14 days. Plan:  Refilled for # 60/1 refill. Jarvis Newcomer, LPN Domingo Dimes

## 2011-06-23 ENCOUNTER — Ambulatory Visit (HOSPITAL_COMMUNITY)
Admission: RE | Admit: 2011-06-23 | Discharge: 2011-06-23 | Disposition: A | Discharge status: Home / Self Care | Payer: BC Managed Care – PPO | Source: Ambulatory Visit | Attending: Gynecologic Oncology | Admitting: Gynecologic Oncology

## 2011-06-23 ENCOUNTER — Other Ambulatory Visit: Payer: Self-pay | Admitting: Gynecologic Oncology

## 2011-06-23 DIAGNOSIS — D071 Carcinoma in situ of vulva: Secondary | ICD-10-CM | POA: Insufficient documentation

## 2011-06-23 DIAGNOSIS — Z01812 Encounter for preprocedural laboratory examination: Secondary | ICD-10-CM | POA: Insufficient documentation

## 2011-06-23 HISTORY — PX: VULVAR LESION REMOVAL: SHX5391

## 2011-06-24 LAB — PREGNANCY, URINE: Preg Test, Ur: NEGATIVE

## 2011-06-24 LAB — CBC
HCT: 42.5
Platelets: 288
RBC: 4.63

## 2011-06-29 NOTE — Op Note (Signed)
  NAMEAUDRIE, [name] NO.:  [id number]  MEDICAL RECORD NO.:  [id number]  LOCATION:  DAYL                         FACILITY:  [facility]  PHYSICIAN:  Laurette Schimke, MD     DATE OF BIRTH:  Sep 24, 1960  DATE OF PROCEDURE:  06/23/2011 DATE OF DISCHARGE:  06/23/2011                              OPERATIVE REPORT   PREOPERATIVE DIAGNOSIS:  Vulvar intraepithelial neoplasia-3.  POSTOPERATIVE DIAGNOSIS:  Vulvar intraepithelial neoplasia-3.  PROCEDURE:  Wide local excision 5 x 6 cm of inferior left labia majora and 1 x 1 cm excision of the right labia minora.  SURGEON:  Laurette Schimke, MD  ASSISTANT:  Telford Nab, R.N.  ANESTHESIA:  General endotracheal.  FINDINGS:  On placing the acetic acid, a thick warty lesion was noted, inferior to the left labia majora and lateral.  There were contiguous areas of white epithelium noted in the midportion medial aspect of the right labia minora.  DESCRIPTION OF PROCEDURE:  The patient was taken to the operating room and placed under general endotracheal anesthesia, was placed in dorsal lithotomy position and prepped and draped in the usual sterile fashion. The areas for excision were marked 5 mm beyond gross visual pathology. With scalpel incision, elliptical incision was made to encompass the lesion.  The skin was then removed.  The depth is approximately 4 mm with a needlepoint Bovie tip.  The area was irrigated and drained.  The defect was closed in 2 layers, the first with deep sutures 2-0 Vicryl sutures and the third running subcuticular closure, this related to the left labia majora incision.  The right labia minora excision was closed with running subcuticular 3-0 Vicryl suture.  Sponges and needle count correct x3.  ESTIMATED BLOOD LOSS:  Minimal.  DRAINS:  None.  DISPOSITION:  The patient was taken to the recovery room in stable condition.     Laurette Schimke, MD     WB/MEDQ  D:  06/23/2011  T:   06/24/2011  Job:  [id number]  cc:   Telford Nab, R.N. [address] [place], [place] [address]  Lesly Dukes, M.D.  Electronically Signed by Laurette Schimke MD on 06/29/2011 03:13:12 PM

## 2011-07-16 ENCOUNTER — Ambulatory Visit: Payer: BC Managed Care – PPO | Attending: Gynecologic Oncology | Admitting: Gynecologic Oncology

## 2011-07-16 DIAGNOSIS — D071 Carcinoma in situ of vulva: Secondary | ICD-10-CM | POA: Insufficient documentation

## 2011-07-17 NOTE — Consult Note (Signed)
  NAMEVIRGIN, Kelli                 ACCOUNT NO.:  [id number]  MEDICAL RECORD NO.:  [id number]  LOCATION:  GYN                          FACILITY:  [facility]  PHYSICIAN:  Laurette Schimke, MD     DATE OF BIRTH:  [birth year]  DATE OF CONSULTATION:  07/16/2011 DATE OF DISCHARGE:                                CONSULTATION   REASON FOR VISIT:  Postoperative check for high-grade vulva intraepithelial neoplasia, grade 3.  HISTORY OF PRESENT ILLNESS:  This is a 50 year old who was noted by Dr. Penne Lash to have an abnormality on the perineum.  She underwent a biopsy and the biopsy was consistent with VIN 3.  On June 23, 2011, she underwent a wide local excision of a left vulvar lesion and right vulva lesion.  Both biopsies returned with evidence of VIN 3.  The margins of the left vulva excision were positive.  Kelli Frank since has reported skin breakdown.  She has been diligent in care and management of this area.  She denies any bleeding, but does report some serosanguineous discharge.  PAST MEDICAL HISTORY:  No interval changes.  PAST SURGICAL HISTORY:  Interval wide local excision of left and right vulva lesions.  REVIEW OF SYSTEMS:  Denies fever, chills, nausea, vomiting, or abdominal pain.  PHYSICAL EXAMINATION:  GENERAL:  Well-developed female, in no acute distress. VITAL SIGNS:  Weight 131.6 pounds, blood pressure 120/68, pulse 78, respiratory rate is 18, and temperature 99 degrees Celsius. ABDOMEN:  Soft, nontender. LYMPH NODE SURVEY:  No inguinal adenopathy. PELVIC EXAMINATION:  External genitalia notable for skin separation of the right and left vulva incision, suture present, area is clean with excellent granulation tissue.  No evidence of cellulitic changes or lymphangitic streaking.  IMPRESSION:  Multifocal vulvar intraepithelial neoplasia 3.  Kelli Frank has been encouraged to continue the wonderful care she is currently providing.  I have asked her to follow up with me in  1 month.     Laurette Schimke, MD     WB/MEDQ  D:  07/16/2011  T:  07/16/2011  Job:  [id number]  cc:   Lesly Dukes, M.D.  Telford Nab, R.N. [address] [place], [place] [address]  Electronically Signed by Laurette Schimke MD on 07/17/2011 11:11:48 AM

## 2011-07-29 NOTE — Progress Notes (Signed)
Consult Note: Gyn-Onc  Kelli Frank 50 y.o. female   CC: No chief complaint on file.   HPI: HISTORY OF PRESENT ILLNESS:  This is a 50 year old gravida 2, para 0, last normal menstrual period approximately 3 weeks ago.  The patient and her partner first observed the lesion in Feb 2012 that appeared like a bruise, it has increased in size over the interim.  She presented to Dr. Penne Lash and a biopsy was obtained of the lesion. Lesion per the patient, was in the inferior-most aspect of the genitalia.  Biopsies at left inner 1 o'clock and left inner 6 o'clock returned with VIN-3.   Interval History:  On 06/2011 she underwent WLE.  Pathology c/w VIN III  Review of Systems Constitutional  Feels well  Skin/Breast  No rash, sores, jaundice, itching, dryness, lumps, swelling, breast pain, or nipple discharge. Age spots  Cardiovascular  No chest pain, shortness of breath, or edema  Pulmonary  No cough or wheeze.  Gastro Intestinal  No nausea, vomitting, or diarrhoea. No bright red blood per rectum, no abdominal pain, change in bowel movement, or constipation.  Genito Urinary  No frequency, urgency, dysuria,  Musculo Skeletal  No myalgia, arthralgia, joint swelling or pain  Neurologic  No weakness, numbness, change in gait,  Psychology  No depression, anxiety, insomnia.    Current Meds:  Outpatient Encounter Prescriptions as of 08/20/2011  Medication Sig Dispense Refill  . fesoterodine (TOVIAZ) 4 MG TB24 Take 1 tablet (4 mg total) by mouth daily.  30 tablet  1  . lidocaine (XYLOCAINE) 5 % ointment Apply topically as needed.  35.44 g  0  . Lysine HCl 1000 MG TABS Take 2,000 mg by mouth daily.        . varenicline (CHANTIX) 1 MG tablet Take 1 tablet (1 mg total) by mouth 2 (two) times daily.  60 tablet  1    Allergy:  Allergies  Allergen Reactions  . Penicillins   . Propoxyphene N-Acetaminophen   . Tetanus-Diphtheria Toxoids     Social Hx:   History   Social History  .  Marital Status: Single    Spouse Name: N/A    Number of Children: N/A  . Years of Education: N/A   Occupational History  . Not on file.   Social History Main Topics  . Smoking status: Former Smoker -- 1.0 packs/day for 25 years  . Smokeless tobacco: Not on file  . Alcohol Use: Yes     occasional  . Drug Use: No  . Sexually Active: Yes -- Female partner(s)   Other Topics Concern  . Not on file   Social History Narrative  . No narrative on file    Past Surgical Hx:  Past Surgical History  Procedure Date  . Tubal ligation 2009    bilateral  . Tonsillectomy 1970    Past Medical Hx:  Past Medical History  Diagnosis Date  . Ovarian cyst, left   . Smoker     Family Hx:  Family History  Problem Relation Age of Onset  . Heart attack Father   . Hypertension Father   . Hyperlipidemia Father   . Hyperlipidemia Brother   . Thyroid disease Brother   . Cancer      grandfather/colorectal      Physical Exam: Neck  Supple without any enlargements.  Genito Urinary  Vulva/vagina: Normal external female genitalia.  No lesions.  2mm area with residual granulation tissue.  Silver nitrate applied.  No acetowhite lesions.  No discharge, patient on her menses.  Extremities  No bilateral cyanosis, clubbing or edema. No rash, lesions or petiche.    Assessment/Plan:  Multifocal vulvar intraepithelial neoplasia grade 3. The surgical area is healed well.   The patient was counseled regarding signs and symptoms of recurrent dysplasia.  Follow up: Ms. Warbington will followup with me in 6 months. At that time colposcopy will be performed. She's been advised to followup with Dr. Penne Lash for her routine GYN care. She is aware that if our next visit is without any evidence of abnormalities she will resume her care with Dr. Penne Lash.

## 2011-08-19 ENCOUNTER — Encounter: Payer: Self-pay | Admitting: *Deleted

## 2011-08-20 ENCOUNTER — Ambulatory Visit: Payer: BC Managed Care – PPO | Attending: Gynecologic Oncology | Admitting: Gynecologic Oncology

## 2011-08-20 ENCOUNTER — Encounter: Payer: Self-pay | Admitting: Gynecologic Oncology

## 2011-08-20 VITALS — BP 106/64 | HR 62 | Temp 98.0°F | Resp 16 | Ht 64.09 in | Wt 134.2 lb

## 2011-08-20 DIAGNOSIS — D071 Carcinoma in situ of vulva: Secondary | ICD-10-CM | POA: Insufficient documentation

## 2011-08-20 DIAGNOSIS — Z79899 Other long term (current) drug therapy: Secondary | ICD-10-CM | POA: Insufficient documentation

## 2011-08-20 HISTORY — DX: Carcinoma in situ of vulva: D07.1

## 2011-08-20 NOTE — Patient Instructions (Addendum)
FU in 6 months

## 2011-10-15 ENCOUNTER — Ambulatory Visit (INDEPENDENT_AMBULATORY_CARE_PROVIDER_SITE_OTHER): Payer: BC Managed Care – PPO | Admitting: Family Medicine

## 2011-10-15 ENCOUNTER — Encounter: Payer: Self-pay | Admitting: Family Medicine

## 2011-10-15 DIAGNOSIS — J329 Chronic sinusitis, unspecified: Secondary | ICD-10-CM

## 2011-10-15 MED ORDER — AZITHROMYCIN 250 MG PO TABS: ORAL_TABLET | ORAL | Status: AC

## 2011-10-15 NOTE — Progress Notes (Signed)
  Subjective:    Patient ID: Kelli Frank, female    DOB: 16-Aug-1961, 51 y.o.   MRN: [id number]  HPI Had the flu 3 weeks ago. Missed work.  By the following Monday felt fine. Then about 4-5 days later started with cold sxs.  Still not better. Severe nasal congestion, deep cough, feeling SOB.  No fever.  Nasal discharge is bloody.  No energy.  No ST or ear pain. Some cervical LN.  Eyes feel puffy and eyes burn.  Did try advil and airborne.  Husband with similar sxs.    Review of Systems     Objective:   Physical Exam  Constitutional: She is oriented to person, place, and time. She appears well-developed and well-nourished.  HENT:  Head: Normocephalic and atraumatic.  Right Ear: External ear normal.  Left Ear: External ear normal.  Nose: Nose normal.  Mouth/Throat: Oropharynx is clear and moist.       TMs and canals are clear.   Eyes: Conjunctivae and EOM are normal. Pupils are equal, round, and reactive to light.  Neck: Neck supple. No thyromegaly present.       Mild ant bilat cerv LN.   Cardiovascular: Normal rate, regular rhythm and normal heart sounds.   Pulmonary/Chest: Effort normal and breath sounds normal. She has no wheezes.  Lymphadenopathy:    She has cervical adenopathy.  Neurological: She is alert and oriented to person, place, and time.  Skin: Skin is warm and dry.  Psychiatric: She has a normal mood and affect. Her behavior is normal.          Assessment & Plan:  Sinusitis - Will tx with zpack.  Call if not better in one week. H.O. Given. Symptomatic care.   Elevated BP- No prior hx of HTN. Recheck with nurse visit in 2 weeks.

## 2011-10-15 NOTE — Patient Instructions (Signed)

## 2011-10-16 ENCOUNTER — Ambulatory Visit: Payer: BC Managed Care – PPO | Admitting: Family Medicine

## 2011-10-28 ENCOUNTER — Ambulatory Visit: Payer: BC Managed Care – PPO | Admitting: Family Medicine

## 2012-03-08 ENCOUNTER — Ambulatory Visit: Payer: BC Managed Care – PPO | Admitting: Gynecologic Oncology

## 2012-06-15 ENCOUNTER — Encounter: Payer: Self-pay | Admitting: Physician Assistant

## 2012-06-15 ENCOUNTER — Ambulatory Visit (INDEPENDENT_AMBULATORY_CARE_PROVIDER_SITE_OTHER): Payer: BC Managed Care – PPO | Admitting: Physician Assistant

## 2012-06-15 VITALS — BP 123/73 | HR 89 | Temp 98.5°F | Ht 64.0 in | Wt 133.0 lb

## 2012-06-15 DIAGNOSIS — R300 Dysuria: Secondary | ICD-10-CM

## 2012-06-15 DIAGNOSIS — N390 Urinary tract infection, site not specified: Secondary | ICD-10-CM

## 2012-06-15 LAB — POCT URINALYSIS DIPSTICK
Bilirubin, UA: NEGATIVE
Glucose, UA: NEGATIVE
Nitrite, UA: POSITIVE
Protein, UA: 100
Spec Grav, UA: >=1.03
Urobilinogen, UA: 0.2
pH, UA: 6.5

## 2012-06-15 MED ORDER — CIPROFLOXACIN HCL 500 MG PO TABS: 500.0000 mg | ORAL_TABLET | Freq: Two times a day (BID) | ORAL | Status: DC

## 2012-06-15 NOTE — Patient Instructions (Addendum)

## 2012-06-15 NOTE — Progress Notes (Signed)
  Subjective:    Patient ID: Kelli Frank, female    DOB: [birth year], 51 y.o.   MRN: [id number]  HPI Patient presents to the clinic for 3 days of urinary frequency, urgency and pressure. Today she has started having low back pain. She has had UTI's in the past and 3 days of cipro has not worked and she has to have more called in. She denies any fever, chills, SOB, N/V or headache. She has increased her water intake but has not helped and has continued to get worse.   Her BP is elevated today. She reports that it is usually low. She denies any CP, palpitations, or SOB. She admits to rushing today not to be late.    Review of Systems     Objective:   Physical Exam  Constitutional: She is oriented to person, place, and time. She appears well-developed and well-nourished.  HENT:  Head: Normocephalic and atraumatic.  Cardiovascular: Normal rate, regular rhythm and normal heart sounds.   Pulmonary/Chest: Effort normal and breath sounds normal.       NO CVA tenderness.  Abdominal: Soft. Bowel sounds are normal. There is no tenderness.  Neurological: She is alert and oriented to person, place, and time.  Skin: Skin is warm and dry.  Psychiatric: She has a normal mood and affect. Her behavior is normal.          Assessment & Plan:    UTI- UA positive for leuks, blood, and nitrates. Treated with Cipro for 7 days. Gave H.O. For UTI. Stay hydrated. Call in not improving in next 48hours or not completely feeling better after finishing abx.  Elevated blood pressure- Rechecked 123/73. No further work up or intervention needed. REassured patient that BP looked great.

## 2012-07-29 ENCOUNTER — Ambulatory Visit (INDEPENDENT_AMBULATORY_CARE_PROVIDER_SITE_OTHER): Payer: BC Managed Care – PPO | Admitting: Family Medicine

## 2012-07-29 ENCOUNTER — Encounter: Payer: Self-pay | Admitting: Family Medicine

## 2012-07-29 VITALS — BP 138/87 | HR 96 | Temp 98.1°F | Ht 61.0 in | Wt 132.0 lb

## 2012-07-29 DIAGNOSIS — J209 Acute bronchitis, unspecified: Secondary | ICD-10-CM

## 2012-07-29 DIAGNOSIS — F17200 Nicotine dependence, unspecified, uncomplicated: Secondary | ICD-10-CM

## 2012-07-29 DIAGNOSIS — Z720 Tobacco use: Secondary | ICD-10-CM

## 2012-07-29 MED ORDER — VARENICLINE TARTRATE 0.5 MG X 11 & 1 MG X 42 PO MISC: ORAL | Status: DC

## 2012-07-29 MED ORDER — DOXYCYCLINE HYCLATE 100 MG PO TABS: 100.0000 mg | ORAL_TABLET | Freq: Two times a day (BID) | ORAL | Status: DC

## 2012-07-29 NOTE — Patient Instructions (Signed)

## 2012-07-29 NOTE — Progress Notes (Signed)
  Subjective:    Patient ID: Kelli Frank, female    DOB: [birth year], 51 y.o.   MRN: [id number]  HPI Has been sick for about 12 days.  Says started in her sinuses and then moved to her chest. Has been taking Allegra- helps a lot.  Hs had allergies most of her life.  No fever.  Productive cloudy sputum.  + ST initially but that is better.  No SOB.  Chest feels heavy and harder to breath. Still smoking.     Review of Systems     Objective:   Physical Exam  Constitutional: She is oriented to person, place, and time. She appears well-developed and well-nourished.  HENT:  Head: Normocephalic and atraumatic.       No facial pain or tenderness.  Cardiovascular: Normal rate, regular rhythm and normal heart sounds.   Pulmonary/Chest: Effort normal.       End inspiratory wheeez on the left. Right is clear.   Neurological: She is alert and oriented to person, place, and time.  Skin: Skin is warm and dry.  Psychiatric: She has a normal mood and affect. Her behavior is normal.          Assessment & Plan:  Acute bronchitis-I. think her sinusitis is actually resolving. Olympus on doxycycline since she's had symptoms for 12 days and she is a smoker which puts her at higher risk. We discussed continuing to work on smoking cessation. Continue symptomatic treatment. Call if not significantly better in one week. Consider chest x-ray if she's not improving.  Tobacco abuse-she's interested in quitting again and would like a prescription for Chantix which she has taken in the past.

## 2013-05-24 ENCOUNTER — Encounter: Payer: Self-pay | Admitting: Family Medicine

## 2013-05-24 ENCOUNTER — Ambulatory Visit (INDEPENDENT_AMBULATORY_CARE_PROVIDER_SITE_OTHER): Payer: BC Managed Care – PPO | Admitting: Family Medicine

## 2013-05-24 VITALS — BP 141/92 | HR 81 | Wt 128.0 lb

## 2013-05-24 DIAGNOSIS — J069 Acute upper respiratory infection, unspecified: Secondary | ICD-10-CM

## 2013-05-24 DIAGNOSIS — H10023 Other mucopurulent conjunctivitis, bilateral: Secondary | ICD-10-CM

## 2013-05-24 DIAGNOSIS — H10029 Other mucopurulent conjunctivitis, unspecified eye: Secondary | ICD-10-CM

## 2013-05-24 MED ORDER — AZITHROMYCIN 1 % OP SOLN: OPHTHALMIC | Status: DC

## 2013-05-24 NOTE — Patient Instructions (Signed)

## 2013-05-24 NOTE — Progress Notes (Signed)
  Subjective:    Patient ID: Kelli Frank, female    DOB: 1961/04/13, 52 y.o.   MRN: [id number]  HPI Yesterday started noticed some itchiness and redness in her left eye. This morning she noticed some itchiness in her right eye. Since last light her left eye was actually very red and swollen. She also started to notice some drainage at the corners. She says it feels like CMP per. No actual visual changes. 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 She has also had some cold symptoms for about for 5 days with some mild nasal congestion. No fevers. She did have some chills yesterday. She's not currently on any antibiotics or taking anything for her cold symptoms. No ear pain.   Review of Systems     Objective:   Physical Exam  Constitutional: She is oriented to person, place, and time. She appears well-developed and well-nourished.  HENT:  Head: Normocephalic and atraumatic.  Right Ear: External ear normal.  Left Ear: External ear normal.  Nose: Nose normal.  Mouth/Throat: Oropharynx is clear and moist.  TMs and canals are clear.   Eyes: EOM are normal. Pupils are equal, round, and reactive to light. Right eye exhibits no discharge. Left eye exhibits no discharge.  Sclera is injected in the left eye, none on the right eye. The upper and lower medial side of the lids are mildly swollen and pink. No actual rash. No discharge in the corners of the eye. Extremity movements are intact.  Neck: Neck supple. No thyromegaly present.  Cardiovascular: Normal rate, regular rhythm and normal heart sounds.   Pulmonary/Chest: Effort normal and breath sounds normal. She has no wheezes.  Lymphadenopathy:    She has no cervical adenopathy.  Neurological: She is alert and oriented to person, place, and time.  Skin: Skin is warm and dry.  Psychiatric: She has a normal mood and affect.          Assessment & Plan:  Conjunctivitis-discussed that most of the time that this is viral, especially since concomitant with cold  symptoms. 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 Given a prescription for azithromycin ophthalmic solution. Call if not significantly better by the end of the week or noticing any change in vision or worsening of symptoms.  URI-most likely viral. Symptomatic care recommended. Call if not significantly better in one week.

## 2013-10-10 ENCOUNTER — Encounter: Payer: Self-pay | Admitting: Family Medicine

## 2013-10-10 ENCOUNTER — Ambulatory Visit (INDEPENDENT_AMBULATORY_CARE_PROVIDER_SITE_OTHER): Payer: BC Managed Care – PPO | Admitting: Family Medicine

## 2013-10-10 VITALS — BP 138/75 | HR 96 | Temp 98.3°F | Ht 61.0 in | Wt 137.0 lb

## 2013-10-10 DIAGNOSIS — R319 Hematuria, unspecified: Secondary | ICD-10-CM

## 2013-10-10 DIAGNOSIS — N390 Urinary tract infection, site not specified: Secondary | ICD-10-CM

## 2013-10-10 DIAGNOSIS — R300 Dysuria: Secondary | ICD-10-CM

## 2013-10-10 LAB — POCT URINALYSIS DIPSTICK
BILIRUBIN UA: NEGATIVE
GLUCOSE UA: NEGATIVE
KETONES UA: NEGATIVE
Nitrite, UA: POSITIVE
PH UA: 7.5
SPEC GRAV UA: 1.02
Urobilinogen, UA: 0.2

## 2013-10-10 MED ORDER — CIPROFLOXACIN HCL 500 MG PO TABS: 500.0000 mg | ORAL_TABLET | Freq: Two times a day (BID) | ORAL | Status: AC

## 2013-10-10 NOTE — Addendum Note (Signed)
Addended by: Deno EtienneBARKLEY, Stephana Morell L on: 10/10/2013 04:12 PM   Modules accepted: Orders

## 2013-10-10 NOTE — Progress Notes (Signed)
   Subjective:    Patient ID: Kelli Frank, female    DOB: 12/19/1960, 53 y.o.   MRN: [id number]  HPI Urinary urgency and pelvic pressure x 2 days. No hematuria. No fever or chills.  No back pain.  No recent antibiotic use or catheterization.   Review of Systems     Objective:   Physical Exam  Constitutional: She is oriented to person, place, and time. She appears well-developed and well-nourished.  HENT:  Head: Normocephalic and atraumatic.  Musculoskeletal:  NO CVA tenderness   Neurological: She is alert and oriented to person, place, and time.  Skin: Skin is warm and dry.  Psychiatric: She has a normal mood and affect. Her behavior is normal.          Assessment & Plan:  UTI - Will tx with Cipro.  She does better on longer course. Call if not improving or resolving.

## 2013-10-10 NOTE — Patient Instructions (Signed)
Urinary Tract Infection  Urinary tract infections (UTIs) can develop anywhere along your urinary tract. Your urinary tract is your body's drainage system for removing wastes and extra water. Your urinary tract includes two kidneys, two ureters, a bladder, and a urethra. Your kidneys are a pair of bean-shaped organs. Each kidney is about the size of your fist. They are located below your ribs, one on each side of your spine.  CAUSES  Infections are caused by microbes, which are microscopic organisms, including fungi, viruses, and bacteria. These organisms are so small that they can only be seen through a microscope. Bacteria are the microbes that most commonly cause UTIs.  SYMPTOMS   Symptoms of UTIs may vary by age and gender of the patient and by the location of the infection. Symptoms in young women typically include a frequent and intense urge to urinate and a painful, burning feeling in the bladder or urethra during urination. Older women and men are more likely to be tired, shaky, and weak and have muscle aches and abdominal pain. A fever may mean the infection is in your kidneys. Other symptoms of a kidney infection include pain in your back or sides below the ribs, nausea, and vomiting.  DIAGNOSIS  To diagnose a UTI, your caregiver will ask you about your symptoms. Your caregiver also will ask to provide a urine sample. The urine sample will be tested for bacteria and white blood cells. White blood cells are made by your body to help fight infection.  TREATMENT   Typically, UTIs can be treated with medication. Because most UTIs are caused by a bacterial infection, they usually can be treated with the use of antibiotics. The choice of antibiotic and length of treatment depend on your symptoms and the type of bacteria causing your infection.  HOME CARE INSTRUCTIONS   If you were prescribed antibiotics, take them exactly as your caregiver instructs you. Finish the medication even if you feel better after you  have only taken some of the medication.   Drink enough water and fluids to keep your urine clear or pale yellow.   Avoid caffeine, tea, and carbonated beverages. They tend to irritate your bladder.   Empty your bladder often. Avoid holding urine for long periods of time.   Empty your bladder before and after sexual intercourse.   After a bowel movement, women should cleanse from front to back. Use each tissue only once.  SEEK MEDICAL CARE IF:    You have back pain.   You develop a fever.   Your symptoms do not begin to resolve within 3 days.  SEEK IMMEDIATE MEDICAL CARE IF:    You have severe back pain or lower abdominal pain.   You develop chills.   You have nausea or vomiting.   You have continued burning or discomfort with urination.  MAKE SURE YOU:    Understand these instructions.   Will watch your condition.   Will get help right away if you are not doing well or get worse.  Document Released: 06/17/2005 Document Revised: 03/08/2012 Document Reviewed: 10/16/2011  [employer] Patient Information 2014 [employer]

## 2013-12-13 ENCOUNTER — Ambulatory Visit (INDEPENDENT_AMBULATORY_CARE_PROVIDER_SITE_OTHER): Payer: BC Managed Care – PPO | Admitting: Physician Assistant

## 2013-12-13 ENCOUNTER — Encounter: Payer: Self-pay | Admitting: Physician Assistant

## 2013-12-13 VITALS — BP 145/82 | HR 89 | Temp 98.2°F | Ht 61.0 in | Wt 137.0 lb

## 2013-12-13 DIAGNOSIS — J3489 Other specified disorders of nose and nasal sinuses: Secondary | ICD-10-CM

## 2013-12-13 DIAGNOSIS — J309 Allergic rhinitis, unspecified: Secondary | ICD-10-CM

## 2013-12-13 DIAGNOSIS — R0981 Nasal congestion: Secondary | ICD-10-CM

## 2013-12-13 MED ORDER — METHYLPREDNISOLONE (PAK) 4 MG PO TABS: ORAL_TABLET | ORAL | Status: DC

## 2013-12-13 NOTE — Progress Notes (Signed)
   Subjective:    Patient ID: Kelli Frank, female    DOB: 09/28/1960, 53 y.o.   MRN: [id number]  HPI Patient is a 53 year old female who presents to the clinic with nasal congestion and runny nose. She does not have a history of allergies. Symptoms started yesterday after 4 sneezes. She denies any fever, chills, ear pain, sore throat, cough, shortness of breath or wheezing. She's tried airborne would benefit. She's used Afrin which does help with nasal congestion. Sudafed daily helps minimally. She denies any other sick contacts in her house.   Review of Systems     Objective:   Physical Exam  Constitutional: She is oriented to person, place, and time. She appears well-developed and well-nourished.  HENT:  Head: Normocephalic and atraumatic.  Right Ear: External ear normal.  Left Ear: External ear normal.  Nose: Nose normal.  Mouth/Throat: Oropharynx is clear and moist.  TMs injected but no blood or pus present.  Some sinus pressure around the nasal bridge bilaterally.  Bilateral nares red and swollen. Left nares completely swollen shut, right knee are patent.  Eyes:  Conjunctiva injected bilaterally with clear watery discharge.  Neck: Normal range of motion. Neck supple.  Cardiovascular: Normal rate, regular rhythm and normal heart sounds.   Pulmonary/Chest: Effort normal and breath sounds normal. She has no wheezes.  Lymphadenopathy:    She has no cervical adenopathy.  Neurological: She is alert and oriented to person, place, and time.  Psychiatric: She has a normal mood and affect. Her behavior is normal.          Assessment & Plan:   Allergic rhinitis/sinusitis/nasal congestion- discuss with patient feel like this is allergy related. Flonase OTC 2 sprays each nostril daily.  Medrol dose pak.  Mucinex DM 1-2 tab twice a day. Drink more water.  Consider zyrtec/claritin. Call office if not improving or if worsening.

## 2013-12-13 NOTE — Patient Instructions (Signed)
Flonase OTC 2 sprays each nostril daily.  Medrol dose pak.  Mucinex DM 1-2 tab twice a day. Drink more water.  Consider zyrtec/claritin.

## 2014-05-21 ENCOUNTER — Other Ambulatory Visit: Payer: Self-pay | Admitting: Family Medicine

## 2014-05-21 ENCOUNTER — Ambulatory Visit (INDEPENDENT_AMBULATORY_CARE_PROVIDER_SITE_OTHER): Payer: BC Managed Care – PPO | Admitting: Family Medicine

## 2014-05-21 ENCOUNTER — Telehealth: Payer: Self-pay | Admitting: *Deleted

## 2014-05-21 ENCOUNTER — Other Ambulatory Visit (HOSPITAL_COMMUNITY)
Admission: RE | Admit: 2014-05-21 | Discharge: 2014-05-21 | Disposition: A | Discharge status: Home / Self Care | Payer: BC Managed Care – PPO | Source: Ambulatory Visit | Attending: Family Medicine | Admitting: Family Medicine

## 2014-05-21 ENCOUNTER — Encounter: Payer: Self-pay | Admitting: Family Medicine

## 2014-05-21 VITALS — BP 142/84 | HR 102 | Wt 136.0 lb

## 2014-05-21 DIAGNOSIS — Z124 Encounter for screening for malignant neoplasm of cervix: Secondary | ICD-10-CM

## 2014-05-21 DIAGNOSIS — Z01419 Encounter for gynecological examination (general) (routine) without abnormal findings: Secondary | ICD-10-CM | POA: Diagnosis not present

## 2014-05-21 DIAGNOSIS — R197 Diarrhea, unspecified: Secondary | ICD-10-CM | POA: Diagnosis not present

## 2014-05-21 DIAGNOSIS — D071 Carcinoma in situ of vulva: Secondary | ICD-10-CM

## 2014-05-21 DIAGNOSIS — F17200 Nicotine dependence, unspecified, uncomplicated: Secondary | ICD-10-CM

## 2014-05-21 DIAGNOSIS — Z1151 Encounter for screening for human papillomavirus (HPV): Secondary | ICD-10-CM | POA: Insufficient documentation

## 2014-05-21 MED ORDER — VARENICLINE TARTRATE 0.5 MG X 11 & 1 MG X 42 PO MISC: ORAL | Status: DC

## 2014-05-21 NOTE — Addendum Note (Signed)
Addended by: Deno Etienne on: 05/21/2014 12:18 PM   Modules accepted: Orders

## 2014-05-21 NOTE — Telephone Encounter (Signed)
Pt called to make f/u appt. Last seen 2012. Requested pt have her GYN refer her back to our office, upon receiving the progress notes we will give appt. Pt verbalized understanding.

## 2014-05-21 NOTE — Progress Notes (Signed)
   Subjective:    Patient ID: Kelli Frank, female    DOB: Jul 21, 1961, 53 y.o.   MRN: [id number]  HPI Has had watery stools since April, around the time her father passed away. Thinks has only had 5 solid stools since then. She is due for a colonoscopy  The most solid she gets is like pudding.   No blood or mucous.  Started black cohash for her nightsweats but that made her diarrhea worse. Only using melatonin now.  Eating triggers it.  No specific fever.s  No fever, chills or sweats.   Vulvar spots are coming back. Had a biopsy with Kelli Frank.  Who then referred her to Kelli Frank for further w/u. She had full excision thought margins weren't completely clear.  She was supposed return in 6 months for a colposcopy. 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 She is still having periods.    She wants to quit smoking and try Chantix again.  Review of Systems     Objective:   Physical Exam  Constitutional: She is oriented to person, place, and time. She appears well-developed.  HENT:  Head: Normocephalic and atraumatic.  Abdominal: Soft. Bowel sounds are normal. She exhibits no distension and no mass. There is no tenderness. There is no rebound and no guarding.  Genitourinary: Vagina normal and uterus normal.    Cervix exhibits no motion tenderness, no discharge and no friability. Right adnexum displays no mass, no tenderness and no fullness. Left adnexum displays no mass and no tenderness.  Areas marked in blue are slightly more purple or pigmented. She is concerned about these particular areas because they look similar to the area that was initially biopsied in 2012 it came back positive for cancer. Cervix is stenotic.  Neurological: She is oriented to person, place, and time.  Skin: Skin is warm and dry.  Psychiatric: She has a normal mood and affect. Her behavior is normal.          Assessment & Plan:  Diarrhea,  watery-unclear etiology at this point. Then going on for him is 3-4 months at this point in time. No known triggers per se. No fevers chills or sweats and no blood in the stool which is all reassuring. She's never had a screening colonoscopy so we will make a referral for this she does need one. In the meantime we'll do some blood work as well as do stool cultures and check for C. difficile. This did start around the time that she was in the hospital in the nursing home visiting her father. Next  History of high grade vulvar neoplasia-she's had some pigmentation skin changes around the perineal area. I think it would be best if she gets back in with Kelli Frank to check the area. She might even consider doing punch biopsies and these new areas. We did go ahead and do her Pap smear her today and we'll call her with those results but this is unrelated to her vulvar neoplasia. She did have a stenotic cervix on exam today.  Tobacco abuse-she says she would like to quit smoking about a transient again. Description sent to pharmacy and coupon card provided.

## 2014-05-22 LAB — COMPLETE METABOLIC PANEL WITH GFR
ALBUMIN: 4.3 g/dL (ref 3.5–5.2)
ALK PHOS: 89 U/L (ref 39–117)
ALT: 14 U/L (ref 0–35)
AST: 16 U/L (ref 0–37)
BUN: 10 mg/dL (ref 6–23)
CO2: 26 mEq/L (ref 19–32)
Calcium: 9.4 mg/dL (ref 8.4–10.5)
Chloride: 109 mEq/L (ref 96–112)
Creat: 0.63 mg/dL (ref 0.50–1.10)
GFR, Est African American: >89 mL/min
Glucose, Bld: 95 mg/dL (ref 70–99)
POTASSIUM: 4 meq/L (ref 3.5–5.3)
SODIUM: 142 meq/L (ref 135–145)
Total Bilirubin: 0.2 mg/dL (ref 0.2–1.2)
Total Protein: 6.6 g/dL (ref 6.0–8.3)

## 2014-05-22 LAB — CBC WITH DIFFERENTIAL/PLATELET
BASOS PCT: 0 % (ref 0–1)
Basophils Absolute: 0 10*3/uL (ref 0.0–0.1)
EOS ABS: 0.5 10*3/uL (ref 0.0–0.7)
EOS PCT: 4 % (ref 0–5)
HEMATOCRIT: 41.3 % (ref 36.0–46.0)
HEMOGLOBIN: 13.7 g/dL (ref 12.0–15.0)
LYMPHS ABS: 2.9 10*3/uL (ref 0.7–4.0)
Lymphocytes Relative: 25 % (ref 12–46)
MCH: 30.4 pg (ref 26.0–34.0)
MCHC: 33.2 g/dL (ref 30.0–36.0)
MCV: 91.8 fL (ref 78.0–100.0)
MONO ABS: 0.5 10*3/uL (ref 0.1–1.0)
MONOS PCT: 4 % (ref 3–12)
NEUTROS PCT: 67 % (ref 43–77)
Neutro Abs: 7.8 10*3/uL — ABNORMAL HIGH (ref 1.7–7.7)
Platelets: 343 10*3/uL (ref 150–400)
RBC: 4.5 MIL/uL (ref 3.87–5.11)
RDW: 14.4 % (ref 11.5–15.5)
WBC: 11.6 10*3/uL — ABNORMAL HIGH (ref 4.0–10.5)

## 2014-05-22 LAB — SEDIMENTATION RATE: Sed Rate: 5 mm/hr (ref 0–22)

## 2014-05-22 LAB — FECAL LACTOFERRIN, QUANT: LACTOFERRIN: NEGATIVE

## 2014-05-23 LAB — CYTOLOGY - PAP

## 2014-05-23 LAB — C. DIFFICILE GDH AND TOXIN A/B
C. DIFF TOXIN A/B: NOT DETECTED
C. difficile GDH: NOT DETECTED

## 2014-05-23 NOTE — Progress Notes (Signed)
Quick Note:  Call patient: Your Pap smear is normal. Repeat in 2-3 years. ______ 

## 2014-05-25 LAB — STOOL CULTURE

## 2014-06-19 ENCOUNTER — Telehealth: Payer: Self-pay | Admitting: Gynecologic Oncology

## 2014-06-19 NOTE — Telephone Encounter (Signed)
Office Visit:  GYN ONCOLOGY  Kelli Frank 53 y.o.   CC: VIN III  Assessment/Plan: Multifocal vulvar intraepithelial neoplasia grade 3 in 10/ 2012 . The surgical area is healed well.    HPI:  HISTORY OF PRESENT ILLNESS: This is a 53 y.o. gravida 2, para 0, last normal menstrual period approximately 3 weeks ago. The patient and  her partner first observed the lesion in Feb 2012 that appeared like a bruise, it has increased in size over the interim. She presented to Dr. Penne LashLeggett and a biopsy was obtained of the lesion. Lesion per the patient, was in the inferior-most aspect of the genitalia. Biopsies at left inner 1 o'clock and left inner 6 o'clock  returned with VIN-3.  Interval History:  On 06/2011 she underwent WLE. Pathology c/w VIN III pap.  Advised f/u in 6 months but did not present until today  Pap   04/2014 wnl.  Presents today with    Review of Systems  Constitutional  Feels well  Skin/Breast  No rash, sores, jaundice, itching, dryness, lumps, swelling, breast pain, or nipple discharge. Age spots  Cardiovascular  No chest pain, shortness of breath, or edema  Pulmonary  No cough or wheeze.  Gastro Intestinal  No nausea, vomitting, or diarrhoea. No bright red blood per rectum, no abdominal pain, change in bowel movement, or constipation.  Genito Urinary  No frequency, urgency, dysuria,  Musculo Skeletal  No myalgia, arthralgia, joint swelling or pain  Neurologic  No weakness, numbness, change in gait,  Psychology  No depression, anxiety, insomnia.   Past Surgical Hx:  Past Surgical History  Procedure Laterality Date  . Tubal ligation  2009    bilateral  . Tonsillectomy  1970  . Vulvar lesion removal  06/23/2011    Past Medical Hx:  Past Medical History  Diagnosis Date  . Ovarian cyst, left   . Smoker   . VIN III (vulvar intraepithelial neoplasia III)   . 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   Family Hx:  Family History   Problem  Relation   Age of Onset   .  Heart attack  Father    .  Hypertension  Father    .  Hyperlipidemia  Father    .  Hyperlipidemia  Brother    .  Thyroid disease  Brother    .  Cancer        grandfather/colorectal    Physical Exam:   Neck  Supple without any enlargements.  Genito Urinary  Vulva/vagina: Normal external female genitalia. No lesions. 2mm area with residual granulation tissue. Silver nitrate applied. No acetowhite lesions. No discharge, patient on her menses.  Extremities  No bilateral cyanosis, clubbing or edema. No rash, lesions or petiche.

## 2014-06-20 ENCOUNTER — Ambulatory Visit: Payer: BC Managed Care – PPO | Attending: Gynecologic Oncology | Admitting: Gynecologic Oncology

## 2014-06-20 ENCOUNTER — Encounter: Payer: Self-pay | Admitting: Gynecologic Oncology

## 2014-06-20 VITALS — BP 150/86 | HR 81 | Temp 98.4°F | Resp 20 | Ht 61.0 in | Wt 137.4 lb

## 2014-06-20 DIAGNOSIS — N903 Dysplasia of vulva, unspecified: Secondary | ICD-10-CM

## 2014-06-20 DIAGNOSIS — N9089 Other specified noninflammatory disorders of vulva and perineum: Secondary | ICD-10-CM

## 2014-06-20 DIAGNOSIS — F17200 Nicotine dependence, unspecified, uncomplicated: Secondary | ICD-10-CM | POA: Insufficient documentation

## 2014-06-20 NOTE — Patient Instructions (Signed)
We will contact you with the results of your biopsy from today.  Please call for any questions or concerns. 

## 2014-06-20 NOTE — Addendum Note (Signed)
Addended by: Warner MccreedyROSS, Emet Rafanan D on: 06/20/2014 02:46 PM   Modules accepted: Orders

## 2014-06-20 NOTE — Progress Notes (Signed)
Office Visit:  GYN ONCOLOGY  Kelli Frank 53 y.o.   CC: Vulvar dysplasia  Assessment/Plan: Multifocal vulvar dysplasia.  Both areas biopsied.  If VIN I Kelli Frank has the option to follow.  If VIN 2 or 3 then the options are aldara for 18 weeks versus CO2 laser.  Patient would prefer CO2 laser if the path returns VIN2/3  Will contact with results.   HPI:  HISTORY OF PRESENT ILLNESS: This is a 53 y.o. gravida 2, para 0.  The patient and her partner first observed the lesion in Feb 2012 that appeared like a bruise, it has increased in size over the interim. She presented to Dr. Penne LashLeggett and a biopsy was obtained of the lesion. Lesion per the patient, was in the inferior-most aspect of the genitalia. Biopsies at left inner 1 o'clock and left inner 6 o'clock  returned with VIN-3.    On 06/2011 she underwent WLE. Pathology c/w VIN III with positive margins.  .  Advised f/u in 6 months but did not present until today  Pap   04/2014 wnl.  Presents today with report of bilateral labial lesions for one month.  No itching or beeding   Review of Systems  Constitutional  Feels well  Cardiovascular  No chest pain, shortness of breath, or edema  Pulmonary  No cough or wheeze.  Gastro Intestinal  No nausea, vomitting, or diarrhoea. Intermittent abdominal pain,  Genito Urinary  No frequency, urgency, dysuria,  Musculo Skeletal  No myalgia, arthralgia, joint swelling or pain  Neurologic  No weakness, numbness, change in gait,  Psychology  No depression, anxiety, insomnia.   Past Surgical Hx:  Past Surgical History  Procedure Laterality Date  . Tubal ligation  2009    bilateral  . Tonsillectomy  1970  . Vulvar lesion removal  06/23/2011    Past Medical Hx:  Past Medical History  Diagnosis Date  . Ovarian cyst, left   . Smoker   . VIN III (vulvar intraepithelial neoplasia III)   . 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  Colitis  Will be on a one month course of  steroids. Colonic polyps identified on colonoscopy 05/2014 Mammogram 2013  Family Hx:  Family History   Problem  Relation  Age of Onset   .  Heart attack  Father    .  Hypertension  Father    .  Hyperlipidemia  Father    .  Hyperlipidemia  Brother    .  Thyroid disease  Brother    .  Cancer        grandfather/colorectal    Physical Exam:  BP 150/86  Pulse 81  Temp(Src) 98.4 F (36.9 C) (Oral)  Resp 20  Ht 5\' 1"  (1.549 m)  Wt 137 lb 6.4 oz (62.324 kg)  BMI 25.97 kg/m2  Neck  Supple without any enlargements.  Genito Urinary  Vulva/vagina: Normal external female genitalia. With application of copious acetic acid leisons noted on the bilateral labia minora (kissing) medially near the introitus approximately 4mm.   VULVAR BIOPSY  Procedure explained to patient.  The vular areas were prepped with betadine.  The area was infiltrated with lidocaine.  A biopsy was taken from the left and right inferior labia minora *.  There was minimal bleeding that was  controlled with  silver nitrate and pressure for a few minutes.  . Patient tolerated the procedure well.  Extremities  No bilateral cyanosis, clubbing or edema. No rash, lesions or petiche.

## 2014-06-26 ENCOUNTER — Encounter: Payer: Self-pay | Admitting: Family Medicine

## 2014-07-21 DIAGNOSIS — R55 Syncope and collapse: Secondary | ICD-10-CM | POA: Insufficient documentation

## 2014-07-23 ENCOUNTER — Encounter: Payer: Self-pay | Admitting: Gynecologic Oncology

## 2014-09-11 ENCOUNTER — Telehealth: Payer: Self-pay | Admitting: *Deleted

## 2014-09-11 NOTE — Telephone Encounter (Signed)
Dr. Nelly RoutBrewster would like to see patient for follow up in March 2016. Called pt with appt time for 12/20/14 at 11:45am. Pt agreeable to this appointment. Told her to please call us back if she needs to reschedule or has any other questions or concerns. Pt agreeable to this.

## 2014-10-19 ENCOUNTER — Ambulatory Visit (INDEPENDENT_AMBULATORY_CARE_PROVIDER_SITE_OTHER): Payer: BC Managed Care – PPO | Admitting: Family Medicine

## 2014-10-19 ENCOUNTER — Encounter: Payer: Self-pay | Admitting: Family Medicine

## 2014-10-19 VITALS — BP 146/88 | HR 87 | Ht 61.0 in | Wt 141.0 lb

## 2014-10-19 DIAGNOSIS — Z1231 Encounter for screening mammogram for malignant neoplasm of breast: Secondary | ICD-10-CM | POA: Diagnosis not present

## 2014-10-19 DIAGNOSIS — N951 Menopausal and female climacteric states: Secondary | ICD-10-CM

## 2014-10-19 DIAGNOSIS — J0100 Acute maxillary sinusitis, unspecified: Secondary | ICD-10-CM

## 2014-10-19 DIAGNOSIS — R232 Flushing: Secondary | ICD-10-CM

## 2014-10-19 MED ORDER — VENLAFAXINE HCL ER 37.5 MG PO CP24: ORAL_CAPSULE | ORAL | Status: DC

## 2014-10-19 MED ORDER — AZITHROMYCIN 250 MG PO TABS: ORAL_TABLET | ORAL | Status: DC

## 2014-10-19 NOTE — Progress Notes (Signed)
   Subjective:    Patient ID: Kelli Frank, female    DOB: Apr 05, 1961, 54 y.o.   MRN: [id number]  HPI 4 weeks of ST and post nasal drip and bilat nasal congestion. No ear pain. No fever.   .  No worsening or alleviating symptoms. She's not currently taking any medications for this.  Hot flashes and night sweats x 1 year.  Has tried [employer] but thinks it caused colitis. Also tried [employer], not effective. Now using essential oils. No familhy hx of BrCa. Last period was around October. Had 3 periods in the last year.  In the last 4 months her QOL has decreased.  She feels miserable. + INsomnia.  Feels like it is affecting her work and her job. She has been more tearful and emotional. Has tried melatonin.    Review of Systems     Objective:   Physical Exam  Constitutional: She is oriented to person, place, and time. She appears well-developed and well-nourished.  HENT:  Head: Normocephalic and atraumatic.  Right Ear: External ear normal.  Left Ear: External ear normal.  Nose: Nose normal.  Mouth/Throat: Oropharynx is clear and moist.  TMs and canals are clear.   Eyes: Conjunctivae and EOM are normal. Pupils are equal, round, and reactive to light.  Neck: Neck supple. No thyromegaly present.  Cardiovascular: Normal rate, regular rhythm and normal heart sounds.   Pulmonary/Chest: Effort normal and breath sounds normal. She has no wheezes.  Lymphadenopathy:    She has no cervical adenopathy.  Neurological: She is alert and oriented to person, place, and time.  Skin: Skin is warm and dry.  Psychiatric: She has a normal mood and affect.          Assessment & Plan:   hot flashes/perimenopausal symptom-we discussed hormonal versus nonhormonal Johna Roles we discussed the pros and cons. She would like to start with Effexor first. We'll start with 37.5 mg and then increase to 75 if needed after one week. Follow-up in 6-8 weeks. Next  Due for mammogram. Order placed today she will have this  done downstairs.   acute sinusitis-will treat with azithromycin. Call if not better in one week. Okay to continue symptomatic care.   Time spent 25 minutes, greater than 50% time spent counseling about perimenopausal symptoms and treatment options.

## 2014-12-20 ENCOUNTER — Ambulatory Visit (INDEPENDENT_AMBULATORY_CARE_PROVIDER_SITE_OTHER): Payer: BC Managed Care – PPO | Admitting: Family Medicine

## 2014-12-20 ENCOUNTER — Encounter: Payer: Self-pay | Admitting: Gynecologic Oncology

## 2014-12-20 ENCOUNTER — Ambulatory Visit: Payer: BC Managed Care – PPO | Attending: Gynecologic Oncology | Admitting: Gynecologic Oncology

## 2014-12-20 ENCOUNTER — Ambulatory Visit (INDEPENDENT_AMBULATORY_CARE_PROVIDER_SITE_OTHER): Payer: BC Managed Care – PPO

## 2014-12-20 ENCOUNTER — Encounter: Payer: Self-pay | Admitting: Family Medicine

## 2014-12-20 VITALS — BP 140/83 | HR 84 | Ht 61.0 in | Wt 140.0 lb

## 2014-12-20 VITALS — BP 170/90 | HR 79 | Temp 98.3°F | Resp 20 | Ht 61.0 in | Wt 139.5 lb

## 2014-12-20 DIAGNOSIS — N951 Menopausal and female climacteric states: Secondary | ICD-10-CM

## 2014-12-20 DIAGNOSIS — Z1231 Encounter for screening mammogram for malignant neoplasm of breast: Secondary | ICD-10-CM

## 2014-12-20 DIAGNOSIS — N903 Dysplasia of vulva, unspecified: Secondary | ICD-10-CM | POA: Diagnosis not present

## 2014-12-20 DIAGNOSIS — G4700 Insomnia, unspecified: Secondary | ICD-10-CM

## 2014-12-20 DIAGNOSIS — R7301 Impaired fasting glucose: Secondary | ICD-10-CM

## 2014-12-20 DIAGNOSIS — R232 Flushing: Secondary | ICD-10-CM

## 2014-12-20 DIAGNOSIS — D071 Carcinoma in situ of vulva: Secondary | ICD-10-CM | POA: Diagnosis not present

## 2014-12-20 DIAGNOSIS — R55 Syncope and collapse: Secondary | ICD-10-CM

## 2014-12-20 LAB — POCT GLYCOSYLATED HEMOGLOBIN (HGB A1C): Hemoglobin A1C: 6

## 2014-12-20 MED ORDER — VENLAFAXINE HCL ER 37.5 MG PO CP24: ORAL_CAPSULE | ORAL | Status: DC

## 2014-12-20 MED ORDER — VENLAFAXINE HCL ER 75 MG PO CP24: 75.0000 mg | ORAL_CAPSULE | Freq: Every day | ORAL | Status: DC

## 2014-12-20 NOTE — Progress Notes (Signed)
   Subjective:    Patient ID: Kelli Frank, female    DOB: 03/12/1961, 54 y.o.   MRN: [id number]  HPI F/U hotflashes - effexor was working well until 2 wks ago she began experiencing hot flashes again.  Hasn't had any S.E. Felt like the sweating was much better but now feels like not working well.   She exercises for at least 20-30 minutes daily.  Insomnia - says quit taking the melatonin. Says has using Unisom.  This is working well for now.   Her mammogram was today.    History of impaired fasting glucose-as HER PROBLEM LIST FROM BACK IN 2009 BUT DID NOT SEE ANY RECENT GLUCOSE LEVELS OR A1C LEVELS.   Review of Systems     Objective:   Physical Exam  Constitutional: She is oriented to person, place, and time. She appears well-developed and well-nourished.  HENT:  Head: Normocephalic and atraumatic.  Cardiovascular: Normal rate, regular rhythm and normal heart sounds.   Pulmonary/Chest: Effort normal and breath sounds normal.  Neurological: She is alert and oriented to person, place, and time.  Skin: Skin is warm and dry.  Psychiatric: She has a normal mood and affect. Her behavior is normal.          Assessment & Plan:  Hot flashes-we'll increase her Effexor. She really didn't want to go to 150 mg but it doesn't come in in between dose. So will write her 2 separate prescriptions one for 75 mg and one for 37.5 mg. She can call me if she has any side effects or palms or concerns.  Insomnia-we'll stick with over-the-counter Unisom since it does seem to be working well for her. Also encouraged her to make sure that she's exercising regularly and eating healthy as this has been shown to be helpful for menopausal symptoms.  History of abnormal glucose-we'll check hemoglobin A1c today. Hemoglobin A1c of 6.0. She still in the impaired fasting glucose range. Continue work on diet and exercise and recheck in 6 months.

## 2014-12-20 NOTE — Patient Instructions (Signed)
Follow up with Dr. Linford ArnoldMetheney as scheduled and with GYN ONC PRN.  Please call for any questions or concerns.

## 2014-12-20 NOTE — Progress Notes (Signed)
Office Visit:  GYN ONCOLOGY  Kelli Frank 54 y.o.   CC: Vulvar dysplasia surveillance  Assessment/Plan: Multifocal vulvar dysplasia.  No evidence of dysplasia Follow-up with Dr. Charlann Boxeratherine Metheneny Follow-up with Gyn Onc prn.   HPI:  HISTORY OF PRESENT ILLNESS: This is a 54 y.o. gravida 2, para 0.  The patient and her partner first observed the lesion in Feb 2012 that appeared like a bruise, it has increased in size over the interim. She presented to Dr. Penne LashLeggett and a biopsy was obtained of the lesion. Lesion per the patient, was in the inferior-most aspect of the genitalia. Biopsies at left inner 1 o'clock and left inner 6 o'clock  returned with VIN-3.    On 06/2011 she underwent WLE. Pathology c/w VIN III with positive margins.  Advised f/u in 6 months but did not present until today  Pap   04/2014 wnl.  At the last visit 06/20/2014  reported bilateral labial lesions for one month.  No itching or beeding.  Bx  1. Labium, biopsy, left minora - SQUAMOUS MUCOSA WITH FOCAL KOILOCYTIC ATYPIA, SLIGHT HYPERKERATOSIS AND SLIGHT CHRONIC INFLAMMATION. - NO HIGH GRADE SQUAMOUS INTRAEPITHELIAL LESION OR MALIGNANCY. 2. Labium, biopsy, right minora - SQUAMOUS MUCOSA WITH FOCAL KOILOCYTIC ATYPIA, SLIGHT HYPERKERATOSIS AND SLIGHT CHRONIC INFLAMMATION. - NO HIGH GRADE SQUAMOUS INTRAEPITHELIAL LESION OR MALIGNANCY.  Denies pruritis.  Review of Systems  Constitutional  Feels well  Cardiovascular  No chest pain, shortness of breath, or edema  Pulmonary  No cough or wheeze.  Gastro Intestinal  No nausea, vomitting, or diarrhoea. Intermittent abdominal pain,  Genito Urinary  No frequency, urgency, dysuria, no pruritis or bleeding.  LMP 07/21/2014.  Musculo Skeletal  No myalgia, arthralgia, joint swelling or pain  Neurologic  No weakness, numbness, change in gait,  Reports vasomotor instability Psychology  No depression, anxiety, insomnia.   Past Surgical Hx:  Past Surgical History  Procedure  Laterality Date  . Tubal ligation  2009    bilateral  . Tonsillectomy  1970  . Vulvar lesion removal  06/23/2011    Past Medical Hx:  Past Medical History  Diagnosis Date  . Ovarian cyst, left   . Smoker   . VIN III (vulvar intraepithelial neoplasia III)   . 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  Colitis  Will be on a one month course of steroids. Colonic polyps identified on colonoscopy 05/2014 Mammogram 2013  Family Hx:  Family History   Problem  Relation  Age of Onset   .  Heart attack  Father    .  Hypertension  Father    .  Hyperlipidemia  Father    .  Hyperlipidemia  Brother    .  Thyroid disease  Brother    .  Cancer        grandfather/colorectal    Physical Exam:  BP 170/90 mmHg  Pulse 79  Temp(Src) 98.3 F (36.8 C) (Oral)  Resp 20  Ht 5\' 1"  (1.549 m)  Wt 139 lb 8 oz (63.277 kg)  BMI 26.37 kg/m2  Neck  Supple without any enlargements.  Genito Urinary  Vulva/vagina: Normal external female genitalia. With application of copious 2%  acetic acid to the external genitalia, no  leisons were  noted on the external genitalia. procedure well. Extremities  No bilateral cyanosis, clubbing or edema. No rash, lesions or petiche.

## 2015-03-20 ENCOUNTER — Emergency Department (INDEPENDENT_AMBULATORY_CARE_PROVIDER_SITE_OTHER): Payer: BC Managed Care – PPO

## 2015-03-20 ENCOUNTER — Encounter: Payer: Self-pay | Admitting: *Deleted

## 2015-03-20 ENCOUNTER — Emergency Department (INDEPENDENT_AMBULATORY_CARE_PROVIDER_SITE_OTHER)
Admission: EM | Admit: 2015-03-20 | Discharge: 2015-03-20 | Disposition: A | Discharge status: Home / Self Care | Payer: BC Managed Care – PPO | Source: Home / Self Care | Attending: Family Medicine | Admitting: Family Medicine

## 2015-03-20 DIAGNOSIS — M25531 Pain in right wrist: Secondary | ICD-10-CM

## 2015-03-20 DIAGNOSIS — M654 Radial styloid tenosynovitis [de Quervain]: Secondary | ICD-10-CM

## 2015-03-20 MED ORDER — PREDNISONE 20 MG PO TABS: 20.0000 mg | ORAL_TABLET | Freq: Two times a day (BID) | ORAL | Status: DC

## 2015-03-20 NOTE — ED Provider Notes (Signed)
CSN: [id number]     Arrival date & time 03/20/15  1812 History   None    Chief Complaint  Patient presents with  . Wrist Injury      HPI Comments: Patient fell about 4 to 6 weeks ago, injuring her right wrist.  She has had persistent and increasing pain at the base of her thumb and radial aspect of wrist.  Patient is a 54 y.o. female presenting with wrist pain. The history is provided by the patient.  Wrist Pain This is a new problem. Episode onset: 4 to 6 weeks ago. The problem occurs constantly. The problem has been gradually worsening. Associated symptoms comments: Thumb weakness. Exacerbated by: right wrist movement and grasping. Nothing relieves the symptoms. Treatments tried: small wrist brace. The treatment provided no relief.    Past Medical History  Diagnosis Date  . Ovarian cyst, left   . Smoker   . VIN III (vulvar intraepithelial neoplasia III)   . 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   Past Surgical History  Procedure Laterality Date  . Tubal ligation  2009    bilateral  . Tonsillectomy  1970  . Vulvar lesion removal  06/23/2011   Family History  Problem Relation Age of Onset  . Heart attack Father   . Hypertension Father   . Hyperlipidemia Father   . Hyperlipidemia Brother   . Thyroid disease Brother   . Cancer      grandfather/colorectal   History  Substance Use Topics  . Smoking status: Former Smoker -- 0.20 packs/day for 25 years    Types: Cigarettes  . Smokeless tobacco: Not on file  . Alcohol Use: Yes     Comment: occasional   OB History    Gravida Para Term Preterm AB TAB SAB Ectopic Multiple Living   2    2     0     Review of Systems  All other systems reviewed and are negative.   Allergies  Penicillins; Propoxyphene n-acetaminophen; and Tetanus-diphtheria toxoids td  Home Medications   Prior to Admission medications   Medication Sig Start Date End Date Taking? Authorizing Provider  DiphenhydrAMINE HCl, Sleep,  (UNISOM SLEEPGELS PO) Take 2 capsules by mouth at bedtime as needed.    Historical Provider, MD  predniSONE (DELTASONE) 20 MG tablet Take 1 tablet (20 mg total) by mouth 2 (two) times daily. Take with food. 03/20/15   Lattie Haw, MD  venlafaxine XR (EFFEXOR-XR) 75 MG 24 hr capsule Take 1 capsule (75 mg total) by mouth daily with breakfast. 12/20/14   Agapito Games, MD   BP 170/94 mmHg  Pulse 71  Temp(Src) 98.8 F (37.1 C) (Oral)  Resp 16  Ht 5' 1.5" (1.562 m)  Wt 139 lb (63.05 kg)  BMI 25.84 kg/m2  SpO2 99% Physical Exam  Constitutional: She is oriented to person, place, and time. She appears well-developed and well-nourished. She appears distressed.  HENT:  Head: Normocephalic.  Eyes: Pupils are equal, round, and reactive to light.  Musculoskeletal:       Right wrist: She exhibits decreased range of motion, tenderness, bony tenderness, swelling and crepitus. She exhibits no effusion and no deformity.  Right thumb/wrist dorsally have distinct tenderness over the extensor and abductor tendons.  Palpation there during resisted extension and abduction of the thumb recreates her pain.  There is distinct tenderness and swelling over the Abductor Pollicis Longus tendon.  There is distinct tenderness to palpation over the anatomic snuffbox.  Distal  neurovascular function is intact.    Neurological: She is alert and oriented to person, place, and time.  Skin: Skin is warm and dry. No rash noted. No erythema.  Nursing note and vitals reviewed.   ED Course  Procedures  None   Imaging Review Dg Wrist Complete Right  03/20/2015   CLINICAL DATA:  Fall 5 weeks ago, persistent right wrist pain since fall, medial swelling and pain  EXAM: RIGHT WRIST - COMPLETE 3+ VIEW  COMPARISON:  None.  FINDINGS: Four views of the right wrist submitted. No acute fracture or subluxation. No radiopaque foreign body.  IMPRESSION: Negative.   Electronically Signed   By: Natasha MeadLiviu  Pop M.D.   On: 03/20/2015 18:47      MDM   1. Tommi Rumpse Quervain's tenosynovitis, right    Although X-ray negative, note persistent tenderness to palpation over the anatomic snuffbox (?navicular injury) Thumb spica splint applied.  Begin prednisone burst (will avoid NSAID's because of possible interaction with Effexor). Apply ice pack for10 to 15 minutes every 2 to 3 hours for inflammation and pain.  Continue until pain decreases.  Wear brace.  May take Tylenol if needed for pain. Followup with Dr. Rodney Langtonhomas Thekkekand[facility] ([facility] in 10 to 14 days for continued management.   Lattie HawStephen A Jazzlin Clements, MD 03/20/15 [phone]

## 2015-03-20 NOTE — ED Notes (Signed)
Pt c/o RT wrist injury x 4-6 wks ago post fall. No OTC meds.

## 2015-03-20 NOTE — Discharge Instructions (Signed)
Apply ice pack for10 to 15 minutes every 2 to 3 hours for inflammation and pain.  Continue until pain decreases.  Wear brace.  May take Tylenol if needed for pain.   De Quervain's Tenosynovitis De Quervain's tenosynovitis involves inflammation of one or two tendon linings (sheaths) or strain of one or two tendons to the thumb: extensor pollicis brevis (EPB), or abductor pollicis longus (APL). This causes pain on the side of the wrist and base of the thumb. Tendon sheaths secrete a fluid that lubricates the tendon, allowing the tendon to move smoothly. When the sheath becomes inflamed, the tendon cannot move freely in the sheath. Both the EPB and APL tendons are important for proper use of the hand. The EPB tendon is important for straightening the thumb. The APL tendon is important for moving the thumb away from the index finger (abducting). The two tendons pass through a small tube (canal) in the wrist, near the base of the thumb. When the tendons become inflamed, pain is usually felt in this area. SYMPTOMS   Pain, tenderness, swelling, warmth, or redness over the base of the thumb and thumb side of the wrist.  Pain that gets worse when straightening the thumb.  Pain that gets worse when moving the thumb away from the index finger, against resistance.  Pain with pinching or gripping.  Locking or catching of the thumb.  Limited motion of the thumb.  Crackling sound (crepitation) when the tendon or thumb is moved or touched.  Fluid-filled cyst in the area of the base of the thumb. CAUSES   Tenosynovitis is often linked with overuse of the wrist.  Tenosynovitis may be caused by repeated injury to the thumb muscle and tendon units, and with repeated motions of the hand and wrist, due to friction of the tendon within the lining (sheath).  Tenosynovitis may also be due to a sudden increase in activity or change in activity. RISK INCREASES WITH:  Sports that involve repeated hand and wrist  motions (golf, bowling, tennis, squash, racquetball).  Heavy labor.  Poor physical wrist strength and flexibility.  Failure to warm up properly before practice or play.  Female gender.  New mothers who hold their baby's head for long periods or lift infants with thumbs in the infant's armpit (axilla). PREVENTION  Warm up and stretch properly before practice or competition.  Allow enough time for rest and recovery between practices and competition.  Maintain appropriate conditioning:  Cardiovascular fitness.  Forearm, wrist, and hand flexibility.  Muscle strength and endurance.  Use proper exercise technique. PROGNOSIS  This condition is usually curable within 6 weeks, if treated properly with non-surgical treatment and resting of the affected area.  RELATED COMPLICATIONS   Longer healing time if not properly treated or if not given enough time to heal.  Chronic inflammation, causing recurring symptoms of tenosynovitis. Permanent pain or restriction of movement.  Risks of surgery: infection, bleeding, injury to nerves (numbness of the thumb), continued pain, incomplete release of the tendon sheath, recurring symptoms, cutting of the tendons, tendons sliding out of position, weakness of the thumb, thumb stiffness. TREATMENT  First, treatment involves the use of medicine and ice, to reduce pain and inflammation. Patients are encouraged to stop or modify activities that aggravate the injury. Stretching and strengthening exercises may be advised. Exercises may be completed at home or with a therapist. You may be fitted with a brace or splint, to limit motion and allow the injury to heal. Your caregiver may also choose  to give you a corticosteroid injection, to reduce the pain and inflammation. If non-surgical treatment is not successful, surgery may be needed. Most tenosynovitis surgeries are done as outpatient procedures (you go home the same day). Surgery may involve local, regional  (whole arm), or general anesthesia.  MEDICATION   If pain medicine is needed, nonsteroidal anti-inflammatory medicines (aspirin and ibuprofen), or other minor pain relievers (acetaminophen), are often advised.  Do not take pain medicine for 7 days before surgery.  Prescription pain relievers are often prescribed only after surgery. Use only as directed and only as much as you need.  Corticosteroid injections may be given if your caregiver thinks they are needed. There is a limited number of times these injections may be given. COLD THERAPY   Cold treatment (icing) should be applied for 10 to 15 minutes every 2 to 3 hours for inflammation and pain, and immediately after activity that aggravates your symptoms. Use ice packs or an ice massage. SEEK MEDICAL CARE IF:   Symptoms get worse or do not improve in 2 to 4 weeks, despite treatment.  You experience pain, numbness, or coldness in the hand.  Blue, gray, or dark color appears in the fingernails.  Any of the following occur after surgery: increased pain, swelling, redness, drainage of fluids, bleeding in the affected area, or signs of infection.  New, unexplained symptoms develop. (Drugs used in treatment may produce side effects.) Document Released: 09/07/2005 Document Revised: 11/30/2011 Document Reviewed: 12/20/2008 [facility] Patient Information 2015 [employer]. This information is not intended to replace advice given to you by your health care provider. Make sure you discuss any questions you have with your health care provider.

## 2015-03-21 ENCOUNTER — Telehealth: Payer: Self-pay | Admitting: Emergency Medicine

## 2015-04-03 ENCOUNTER — Encounter: Payer: Self-pay | Admitting: Sports Medicine

## 2015-04-03 ENCOUNTER — Ambulatory Visit (INDEPENDENT_AMBULATORY_CARE_PROVIDER_SITE_OTHER): Payer: BC Managed Care – PPO | Admitting: Sports Medicine

## 2015-04-03 VITALS — BP 135/89 | HR 83 | Ht 61.0 in | Wt 131.0 lb

## 2015-04-03 DIAGNOSIS — M654 Radial styloid tenosynovitis [de Quervain]: Secondary | ICD-10-CM | POA: Insufficient documentation

## 2015-04-03 MED ORDER — MELOXICAM 15 MG PO TABS: ORAL_TABLET | ORAL | Status: DC

## 2015-04-03 NOTE — Progress Notes (Signed)
   Subjective:    I'm seeing this patient as a consultation for:  Dr. Cathren HarshBeese  CC: right wrist pain  HPI: For months this pleasant 54 year old female has had pain that she localizes over the first extensor compartment of the right wrist, moderate, persistent with burning sensations and no radiation. She was seen in urgent care, x-rays were negative and she was placed in a thumb spica brace and referred to me for further evaluation and definitive treatment.she has tried oral NSAIDs without any improvement.  Past medical history, Surgical history, Family history not pertinant except as noted below, Social history, Allergies, and medications have been entered into the medical record, reviewed, and no changes needed.   Review of Systems: No headache, visual changes, nausea, vomiting, diarrhea, constipation, dizziness, abdominal pain, skin rash, fevers, chills, night sweats, weight loss, swollen lymph nodes, body aches, joint swelling, muscle aches, chest pain, shortness of breath, mood changes, visual or auditory hallucinations.   Objective:   General: Well Developed, well nourished, and in no acute distress.  Neuro/Psych: Alert and oriented x3, extra-ocular muscles intact, able to move all 4 extremities, sensation grossly intact. Skin: Warm and dry, no rashes noted.  Respiratory: Not using accessory muscles, speaking in full sentences, trachea midline.  Cardiovascular: Pulses palpable, no extremity edema. Abdomen: Does not appear distended. Right Wrist: Visible fullness and swelling over the first extensor compartment and radial styloid process ROM smooth and normal with good flexion and extension and ulnar/radial deviation that is symmetrical with opposite wrist. Tender to palpation over the first extensor compartment No snuffbox tenderness. No tenderness over Canal of Guyon. Strength 5/5 in all directions without pain. Positive Finkelstein sign, negative Tinel's and Phalen signs Negative  Watson's test.  Procedure: Real-time Ultrasound Guided Injection of right first extensor compartment Device: GE Logiq E  Verbal informed consent obtained.  Time-out conducted.  Noted no overlying erythema, induration, or other signs of local infection.  Skin prepped in a sterile fashion.  Local anesthesia: Topical Ethyl chloride.  With sterile technique and under real time ultrasound guidance: noted synovitis and tendon sheath effusion, also appeared to be a split tear in the abductor pollicis longus. 25-gauge needle advanced into the tendon sheath and taking care to avoid intratendinous injection 1 mL kenalog 40, 2 mL lidocaine were injected easily. Completed without difficulty  Pain immediately resolved suggesting accurate placement of the medication.  Advised to call if fevers/chills, erythema, induration, drainage, or persistent bleeding.  Images permanently stored and available for review in the ultrasound unit.  Impression: Technically successful ultrasound guided injection.  Impression and Recommendations:   This case required medical decision making of moderate complexity.

## 2015-04-03 NOTE — Assessment & Plan Note (Signed)
Tendon sheath injection, meloxicam, continue spica brace. Rehabilitation exercises, return to see me in one month.

## 2015-05-01 ENCOUNTER — Ambulatory Visit (INDEPENDENT_AMBULATORY_CARE_PROVIDER_SITE_OTHER): Payer: BC Managed Care – PPO | Admitting: Sports Medicine

## 2015-05-01 ENCOUNTER — Encounter: Payer: Self-pay | Admitting: Sports Medicine

## 2015-05-01 VITALS — BP 132/91 | HR 82 | Wt 133.0 lb

## 2015-05-01 DIAGNOSIS — M654 Radial styloid tenosynovitis [de Quervain]: Secondary | ICD-10-CM | POA: Diagnosis not present

## 2015-05-01 NOTE — Progress Notes (Signed)
  Subjective:    CC: Follow-up  HPI: Left de Quervain's tendinitis: Completely resolved now after injection, rehabilitation exercises, and a short period in a thumb spica brace. Happy with results.  Past medical history, Surgical history, Family history not pertinant except as noted below, Social history, Allergies, and medications have been entered into the medical record, reviewed, and no changes needed.   Review of Systems: No fevers, chills, night sweats, weight loss, chest pain, or shortness of breath.   Objective:    General: Well Developed, well nourished, and in no acute distress.  Neuro: Alert and oriented x3, extra-ocular muscles intact, sensation grossly intact.  HEENT: Normocephalic, atraumatic, pupils equal round reactive to light, neck supple, no masses, no lymphadenopathy, thyroid nonpalpable.  Skin: Warm and dry, no rashes. Cardiac: Regular rate and rhythm, no murmurs rubs or gallops, no lower extremity edema.  Respiratory: Clear to auscultation bilaterally. Not using accessory muscles, speaking in full sentences. Left Wrist: Inspection normal with no visible erythema or swelling. ROM smooth and normal with good flexion and extension and ulnar/radial deviation that is symmetrical with opposite wrist. Palpation is normal over metacarpals, navicular, lunate, and TFCC; tendons without tenderness/ swelling No snuffbox tenderness. No tenderness over Canal of Guyon. Strength 5/5 in all directions without pain. Negative Finkelstein, tinel's and phalens. Negative Watson's test.  Impression and Recommendations:

## 2015-05-01 NOTE — Assessment & Plan Note (Addendum)
Symptoms completely resolved postinjection, return to see me on an as-needed basis, incorporate rehabilitation exercises into an everyday part of her life.

## 2015-06-21 ENCOUNTER — Ambulatory Visit (INDEPENDENT_AMBULATORY_CARE_PROVIDER_SITE_OTHER): Payer: BC Managed Care – PPO | Admitting: Family Medicine

## 2015-06-21 ENCOUNTER — Encounter: Payer: Self-pay | Admitting: Family Medicine

## 2015-06-21 VITALS — BP 154/79 | HR 72 | Wt 134.0 lb

## 2015-06-21 DIAGNOSIS — R232 Flushing: Secondary | ICD-10-CM

## 2015-06-21 DIAGNOSIS — E785 Hyperlipidemia, unspecified: Secondary | ICD-10-CM

## 2015-06-21 DIAGNOSIS — N951 Menopausal and female climacteric states: Secondary | ICD-10-CM | POA: Diagnosis not present

## 2015-06-21 DIAGNOSIS — R7301 Impaired fasting glucose: Secondary | ICD-10-CM | POA: Diagnosis not present

## 2015-06-21 LAB — POCT GLYCOSYLATED HEMOGLOBIN (HGB A1C): HEMOGLOBIN A1C: 5.8

## 2015-06-21 MED ORDER — VENLAFAXINE HCL ER 75 MG PO CP24: 75.0000 mg | ORAL_CAPSULE | Freq: Every day | ORAL | Status: DC

## 2015-06-21 MED ORDER — VENLAFAXINE HCL ER 37.5 MG PO CP24: ORAL_CAPSULE | ORAL | Status: DC

## 2015-06-21 NOTE — Progress Notes (Signed)
   Subjective:    Patient ID: Kelli Frank, female    DOB: 08-Mar-1961, 54 y.o.   MRN: [id number]  HPI  Here for f/u hotflashes. She is doing well. She did have a period at the end on July.  She feels like the increased dose of Effexor has been completely awesome.   Is completely relieved her hot flashes and her mood irritability and she is sleeping fantastic. She is happy with her regimen.   Impaired fasting glucose-no recent changes or symptoms. No increased thirst or urination.   Review of Systems     Objective:   Physical Exam  Constitutional: She is oriented to person, place, and time. She appears well-developed and well-nourished.  HENT:  Head: Normocephalic and atraumatic.  Cardiovascular: Normal rate, regular rhythm and normal heart sounds.   Pulmonary/Chest: Effort normal and breath sounds normal.  Neurological: She is alert and oriented to person, place, and time.  Skin: Skin is warm and dry.  Psychiatric: She has a normal mood and affect. Her behavior is normal.          Assessment & Plan:  Hot flashes-she's doing fantastic on her current regimen. Refill sent to the pharmacy per follow up in 6 months.    Impaired fasting glucose-heme 11 A1c looks good at 5.8 today which is stable. Repeat in 6 months.  Hyperlipidemia-due to recheck lipids. Lab slip provided today she can go when she is able to fast.

## 2015-06-23 ENCOUNTER — Emergency Department
Admission: EM | Admit: 2015-06-23 | Discharge: 2015-06-23 | Discharge status: Absent without leave | Payer: BC Managed Care – PPO | Source: Home / Self Care

## 2015-06-23 NOTE — ED Notes (Signed)
At 12:48 Pt left did not want to wait .  Was next to be seen.

## 2015-07-05 ENCOUNTER — Ambulatory Visit (INDEPENDENT_AMBULATORY_CARE_PROVIDER_SITE_OTHER): Payer: BC Managed Care – PPO | Admitting: Sports Medicine

## 2015-07-05 VITALS — BP 139/82 | HR 79 | Temp 98.2°F | Resp 18

## 2015-07-05 DIAGNOSIS — S81812S Laceration without foreign body, left lower leg, sequela: Secondary | ICD-10-CM

## 2015-07-05 NOTE — Progress Notes (Signed)
  Subjective:    Kelli Frank is a 54 y.o. female who obtained a laceration 12 days ago, which required closure with 6 sutures. Mechanism of injury: a branch that did not meet her skin. She denies pain, redness, or drainage from the wound. Patient is allergic to tetanus therefore no tetanus vaccine was given at time of suture placement.  The following portions of the patient's history were reviewed and updated as appropriate: allergies, current medications, past family history, past medical history, past social history, past surgical history and problem list.  Review of Systems Pertinent items are noted in HPI.    Objective:    BP 139/82 mmHg  Pulse 79  Temp(Src) 98.2 F (36.8 C) (Oral)  Resp 18  SpO2 99%  LMP 06/01/2012 Injury exam:  A 6 cm laceration noted on the left shin is healing well, without evidence of infection.    Assessment:    Laceration is healing well, without evidence of infection.    Plan:     1. 6 sutures were removed. 2. Wound care discussed. 3. Follow up as needed.    __________________________________________________________________________________   Sutures removed, Dermabond applied, return as needed. ___________________________________________ Ihor Austinhomas J. Benjamin Stainhekkekandam, M.D., ABFM., CAQSM. Primary Care and Sports Medicine [facility]  Adjunct Instructor of Family Medicine  [employer]

## 2015-07-08 ENCOUNTER — Encounter: Payer: Self-pay | Admitting: Physician Assistant

## 2015-07-08 ENCOUNTER — Ambulatory Visit (INDEPENDENT_AMBULATORY_CARE_PROVIDER_SITE_OTHER): Payer: BC Managed Care – PPO | Admitting: Physician Assistant

## 2015-07-08 VITALS — BP 143/93 | HR 91 | Wt 134.0 lb

## 2015-07-08 DIAGNOSIS — T148 Other injury of unspecified body region: Secondary | ICD-10-CM | POA: Diagnosis not present

## 2015-07-08 DIAGNOSIS — L089 Local infection of the skin and subcutaneous tissue, unspecified: Secondary | ICD-10-CM

## 2015-07-08 DIAGNOSIS — S81812S Laceration without foreign body, left lower leg, sequela: Secondary | ICD-10-CM | POA: Diagnosis not present

## 2015-07-08 DIAGNOSIS — T148XXA Other injury of unspecified body region, initial encounter: Principal | ICD-10-CM

## 2015-07-08 MED ORDER — DOXYCYCLINE HYCLATE 100 MG PO TABS: 100.0000 mg | ORAL_TABLET | Freq: Two times a day (BID) | ORAL | Status: DC

## 2015-07-08 NOTE — Progress Notes (Signed)
   Subjective:    Patient ID: Kelli Frank, female    DOB: 05/19/1961, 54 y.o.   MRN: [id number]  HPI Pt presents to the clinic with laceration of left anterior leg. On October 14th, 2016 Dr. Karie Schwalbe removed stitches and placed dermabond. Since Friday wound has become a little more tender and the red area around laceration has gotten redder. She has also noticed that there is more pus than normal coming from wound. She is concerned for infection.    Review of Systems  All other systems reviewed and are negative.      Objective:   Physical Exam  Skin:             Assessment & Plan:  Laceration of left leg/wound infection- I do see signs of infection. Treated with doxycyline for 10 days. Keep area clean and dry. Follow up as needed.

## 2015-07-19 ENCOUNTER — Ambulatory Visit (INDEPENDENT_AMBULATORY_CARE_PROVIDER_SITE_OTHER): Payer: BC Managed Care – PPO | Admitting: Family Medicine

## 2015-07-19 ENCOUNTER — Encounter: Payer: Self-pay | Admitting: Family Medicine

## 2015-07-19 VITALS — BP 153/74 | HR 79 | Temp 98.9°F | Resp 16 | Wt 134.0 lb

## 2015-07-19 DIAGNOSIS — S81812A Laceration without foreign body, left lower leg, initial encounter: Secondary | ICD-10-CM | POA: Diagnosis not present

## 2015-07-19 NOTE — Progress Notes (Signed)
   Subjective:    Patient ID: Kelli Frank, female    DOB: 07/31/1961, 54 y.o.   MRN: [id number]  HPI Wound Infection Pt here to f/u after having a laceration to leg by a branch on 10/2.  She sutures placed but part of the wound was able to be closed because of missing skin. She showed pics on her phone today. Came to clinic for suture removal and skin glue was placed on lesion afterwords to give it support. She came back in 3 days later for inflammation and drainage and was given doxycycline. She completed that 3 days ago.Marland Kitchen.  Here to f/u after abx treatment as she is worried there still may be some infection under the wound.  No fever, chills.  The area looks white/gray under the glue.      Review of Systems     Objective:   Physical Exam  Constitutional: She is oriented to person, place, and time. She appears well-developed and well-nourished.  Neurological: She is alert and oriented to person, place, and time.  Skin: Skin is warm and dry.  The glue that was approx 4 x 5 cm in size was removed in pieces with forceps and iris scissors. Some of the dead skin was removed.  Area cleaned with saline.  Xeroform and gauzed placed over it and dressed with Coban.   Psychiatric: She has a normal mood and affect. Her behavior is normal.    The wound was healing well. Still an open area where a piece of skin was abraded during the original injury.  Good granulation tissues present.       Assessment & Plan:  Laceration of leg leg - healing well.  likley will still take another 2-3 weeks to heal.  Change dressing daily with Xeroform and non stick gauze and coban dressing. No alcohol, peroxide, etc.  Don't scrub at area.  Call if not continuing to heal. Once forms a scab can just apply vaseline for scarring.

## 2015-11-01 ENCOUNTER — Encounter: Payer: Self-pay | Admitting: Family Medicine

## 2015-11-01 ENCOUNTER — Ambulatory Visit (INDEPENDENT_AMBULATORY_CARE_PROVIDER_SITE_OTHER): Payer: BC Managed Care – PPO | Admitting: Family Medicine

## 2015-11-01 VITALS — BP 141/69 | HR 99 | Ht 61.0 in | Wt 141.0 lb

## 2015-11-01 DIAGNOSIS — R232 Flushing: Secondary | ICD-10-CM

## 2015-11-01 DIAGNOSIS — N951 Menopausal and female climacteric states: Secondary | ICD-10-CM

## 2015-11-01 DIAGNOSIS — T50905A Adverse effect of unspecified drugs, medicaments and biological substances, initial encounter: Secondary | ICD-10-CM

## 2015-11-01 DIAGNOSIS — T887XXA Unspecified adverse effect of drug or medicament, initial encounter: Secondary | ICD-10-CM

## 2015-11-01 DIAGNOSIS — D692 Other nonthrombocytopenic purpura: Secondary | ICD-10-CM | POA: Diagnosis not present

## 2015-11-01 MED ORDER — VENLAFAXINE HCL ER 37.5 MG PO CP24: ORAL_CAPSULE | ORAL | Status: DC

## 2015-11-01 MED ORDER — VENLAFAXINE HCL ER 75 MG PO CP24: 75.0000 mg | ORAL_CAPSULE | Freq: Every day | ORAL | Status: DC

## 2015-11-01 NOTE — Progress Notes (Signed)
   Subjective:    Patient ID: Kelli Frank, female    DOB: 11-Jan-1961, 55 y.o.   MRN: [id number]  HPI Near her right wrist she will occ break out with a rash/bruise. It started about a month after she had an injection for DeQuervains on July 13th .  Kenalog and lidocaine injected. Says this was happening initially about every 2 weeks.  Now happening 2-3 times a week. Says can last for about 5-14 days.  Says the blood vessle are more prominent and the wrist looks different.     Hot flashes.  She is doing well on her Effexor.  Loves it and feel it is going well. She would like refills. Has really reduced her night sweats.    Review of Systems     Objective:   Physical Exam          Assessment & Plan:  Purpura/Side effects of steroid injection-she does have some atrophy at the anatomical snuffbox.  This is consistent with a steroid effect especially in areas that don't tend to have a lot of fat. This can also make the blood vessels look more prominent as well. It can call so call some pigmentation changes. But she did show me a picture on her phone where it looks like multiple blood vessels have broken underneath the skin. And this is what she is complaining happens at least a couple times a week. They can start at the base of the thumb and go all the way up to about 3 or 4 inches above the wrist. She says this happens without any trauma or injury. It could be that this is just the blood vessels breaking underneath the skin with just regular activity but would like to consider her seeing a dermatologist just for further evaluation since she feels like the problem is becoming more frequent.  Hot flashes-well-controlled. Continue current regimen. 6 months perception sent to the pharmacy. Follow-up in 6 months.

## 2015-12-20 ENCOUNTER — Ambulatory Visit: Payer: BC Managed Care – PPO | Admitting: Family Medicine

## 2016-02-28 ENCOUNTER — Ambulatory Visit (INDEPENDENT_AMBULATORY_CARE_PROVIDER_SITE_OTHER): Payer: BC Managed Care – PPO | Admitting: Family Medicine

## 2016-02-28 ENCOUNTER — Ambulatory Visit (INDEPENDENT_AMBULATORY_CARE_PROVIDER_SITE_OTHER): Payer: BC Managed Care – PPO

## 2016-02-28 ENCOUNTER — Encounter: Payer: Self-pay | Admitting: Family Medicine

## 2016-02-28 VITALS — BP 153/70 | HR 94 | Wt 133.0 lb

## 2016-02-28 DIAGNOSIS — M79671 Pain in right foot: Secondary | ICD-10-CM

## 2016-02-28 DIAGNOSIS — M25571 Pain in right ankle and joints of right foot: Secondary | ICD-10-CM

## 2016-02-28 DIAGNOSIS — M654 Radial styloid tenosynovitis [de Quervain]: Secondary | ICD-10-CM | POA: Diagnosis not present

## 2016-02-28 MED ORDER — MELOXICAM 15 MG PO TABS: ORAL_TABLET | ORAL | Status: DC

## 2016-02-28 NOTE — Progress Notes (Signed)
Subjective:    CC: Left foot x 2 weeks.    HPI:  Left foot pain - he says she started noticing that her left foot was bothering her. Mostly hurts on the top of the foot. She really denies trauma or injury or trauma thing anything on her foot. She says about a week ago it actually started swelling over that area.  Past medical history, Surgical history, Family history not pertinant except as noted below, Social history, Allergies, and medications have been entered into the medical record, reviewed, and corrections made.   Review of Systems: No fevers, chills, night sweats, weight loss, chest pain, or shortness of breath.   Objective:    General: Well Developed, well nourished, and in no acute distress.  Neuro: Alert and oriented x3, extra-ocular muscles intact, sensation grossly intact.  HEENT: Normocephalic, atraumatic   MSK: eft foot with normal range of motion. Dorsal pedal and posterior tibial pulses are 2+. She does have some pitting edema on the distal part of the foot. She's very tender over the second and third metatarsal heads. She is able to flex the toes but it's very uncomfortable. No increased laxity with anterior drawer test. And strength is 5 out of 5 in all directions.  Impression and Recommendations:   Left foot pain-suspicious for fracture or possibly even stress fracture. Sometimes stress fractures don't show up on imaging that we may still treated as such as her symptoms are very consistent with this. X-ray was negative. Will treat as stress fracture with postop shoe. She does have plenty of vacation time at work so undergo ahead and write her out for a weeks of that she can try to keep it elevated and ice it. Follow-up in 2 weeks to reexamine the foot.

## 2016-03-13 ENCOUNTER — Encounter: Payer: Self-pay | Admitting: Physician Assistant

## 2016-03-13 ENCOUNTER — Ambulatory Visit (INDEPENDENT_AMBULATORY_CARE_PROVIDER_SITE_OTHER): Payer: BC Managed Care – PPO | Admitting: Physician Assistant

## 2016-03-13 VITALS — BP 142/66 | HR 90 | Ht 61.0 in | Wt 131.0 lb

## 2016-03-13 DIAGNOSIS — M79672 Pain in left foot: Secondary | ICD-10-CM | POA: Insufficient documentation

## 2016-03-13 NOTE — Progress Notes (Signed)
   Subjective:    Patient ID: Kelli Frank, female    DOB: 08/29/1961, 55 y.o.   MRN: [id number]  HPI Patient is a 55 year old female who presents to the clinic for 2 weeks follow-up on left foot pain. Her x-rays were normal however Dr. Glade LloydMatheny suspected a stress fracture. She was placed in a postop shoe and encouraged to rest, elevate and ice. She does feel like she is 30-40% improved. When she walks in her. She has minimal discomfort. If she does take the boot off and walk she does feel more discomfort. She has returned to work.   Review of Systems  All other systems reviewed and are negative.      Objective:   Physical Exam  Constitutional: She is oriented to person, place, and time. She appears well-developed and well-nourished.  HENT:  Head: Normocephalic and atraumatic.  Cardiovascular: Normal rate, regular rhythm and normal heart sounds.   Pulmonary/Chest: Effort normal and breath sounds normal.  Musculoskeletal:  Minimal swelling of left forefoot.  Tenderness to palpation over 2nd and 3rd metatarsals. No bruising.   Neurological: She is alert and oriented to person, place, and time.  Skin: Skin is dry.  Psychiatric: She has a normal mood and affect. Her behavior is normal.          Assessment & Plan:  Left foot pain- Will continue and postop she or another 4 weeks. Encouraged patient that for a stress fracture to completely heal can take up to 8 weeks. Discussed causes of stress fractures. She feels like she has identified the source which was barely walking and then going to walk 4 miles a day due to getting a new dog. She does feel like she is improving. She does feel like the swelling is improving. Discussed to continue ice, ibuprofen and elevation. Please call if not significantly improving in the next 4 weeks. May need to consider casting versus more imaging.  FYI documented right foot pain in metheneys note but error. It is left foot.

## 2016-03-13 NOTE — Patient Instructions (Signed)
Stress Fracture Stress fracture is a small break or crack in a bone. A stress fracture can be fully broken (complete) or partially broken (incomplete). The most common sites for stress fractures are the bones in the front of your feet (metatarsals), your heels (calcaneus), and the long bone of your lower leg (tibia). CAUSES A stress fracture is caused by overuse or repetitive exercise, such as running. It happens when a bone cannot absorb any more shock because the muscles around it are weak. Stress fractures happen most commonly when:  You rapidly increase or start a new physical activity.  You use shoes that are worn out or do not fit you properly.  You exercise on a new surface. RISK FACTORS You may be at higher risk for this type of fracture if:  You have a condition that causes weak bones (osteoporosis).  You are female. Stress fractures are more likely to occur in women. SIGNS AND SYMPTOMS The most common symptom of a stress fracture is feeling pain when you are using the affected part of your body. The pain usually goes away when you are resting. Other symptoms may include:  Swelling of the affected area.  Pain in the area when it is touched.  Decreased pain while resting. Stress fracture pain usually develops over time. DIAGNOSIS Diagnosis may include:   Medical history and physical exam.  X-rays.  Bone scan.  MRI. TREATMENT Treatment depends on the severity of your stress fracture. Treatment usually involves resting, icing, compression, and elevation (RICE) of the affected part of your body. Treatment may also include:  Medicines to reduce inflammation.  A cast or a walking shoe.  Crutches.  Surgery. HOME CARE INSTRUCTIONS If You Have a Cast:  Do not stick anything inside the cast to scratch your skin. Doing that increases your risk of infection.  Check the skin around the cast every day. Report any concerns to your health care provider. You may put lotion  on dry skin around the edges of the cast. Do not apply lotion to the skin underneath the cast.  Keep the cast clean and dry.  Cover the cast with a watertight plastic bag to protect it from water while you take a bath or a shower. Do not let the cast get wet.  Do not put pressure on any part of the cast until it is fully hardened. This may take several hours. If You Have a Walking Shoe:  Wear it as directed by your health care provider. Managing Pain, Stiffness, and Swelling  If directed, apply ice to the injured area:  Put ice in a plastic bag.  Place a towel between your skin and the bag.  Leave the ice on for 20 minutes, 2-3 times per day.  Move your fingers or toes often to avoid stiffness and to lessen swelling.  Raise the injured area above the level of your heart while you are sitting or lying down. Activity  Rest as directed by your health care provider. Ask your health care provider if you may do alternative exercises, such as swimming or biking, while you are healing.  Return to your normal activities as directed by your health care provider. Ask your health care provider what activities are safe for you.  Perform range-of-motion exercises only as directed by your health care provider. Safety  Do not use the injured limb to support yourbody weight until your health care provider says that you can. Use crutches if your health care provider tells you to   do so. General Instructions  Do not use any tobacco products, including cigarettes, chewing tobacco, or electronic cigarettes. Tobacco can delay bone healing. If you need help quitting, ask your health care provider.  Take medicines only as directed by your health care provider.  Keep all follow-up visits as directed by your health care provider. This is important. PREVENTION  Only wear shoes that:  Fit well.  Are not worn out.  Eat a healthy diet that contains vitamin D and calcium. This helps keeps your bones  strong.  Be careful when you start a new physical activity. Give your body time to adjust.  Avoid doing only one kind of activity. Do different exercises, such as swimming and running, so that no single part of your body gets overused.  Do strength-training exercises. SEEK MEDICAL CARE IF:  Your pain gets worse.  You have new symptoms.  You have increased swelling. SEEK IMMEDIATE MEDICAL CARE IF:   You lose feeling in the affected area.   This information is not intended to replace advice given to you by your health care provider. Make sure you discuss any questions you have with your health care provider.   Document Released: 11/28/2002 Document Revised: 09/28/2014 Document Reviewed: 04/12/2014 Elsevier Interactive Patient Education 2016 [employer] Inc[employer]  

## 2016-07-22 ENCOUNTER — Other Ambulatory Visit: Payer: Self-pay | Admitting: Family Medicine

## 2016-07-22 DIAGNOSIS — R232 Flushing: Secondary | ICD-10-CM

## 2016-09-23 ENCOUNTER — Ambulatory Visit (INDEPENDENT_AMBULATORY_CARE_PROVIDER_SITE_OTHER): Payer: BC Managed Care – PPO

## 2016-09-23 ENCOUNTER — Ambulatory Visit (INDEPENDENT_AMBULATORY_CARE_PROVIDER_SITE_OTHER): Payer: BC Managed Care – PPO | Admitting: Physician Assistant

## 2016-09-23 ENCOUNTER — Encounter: Payer: Self-pay | Admitting: Physician Assistant

## 2016-09-23 VITALS — BP 151/80 | HR 78 | Temp 97.6°F | Wt 145.0 lb

## 2016-09-23 DIAGNOSIS — F17200 Nicotine dependence, unspecified, uncomplicated: Secondary | ICD-10-CM

## 2016-09-23 DIAGNOSIS — J209 Acute bronchitis, unspecified: Secondary | ICD-10-CM

## 2016-09-23 HISTORY — DX: Nicotine dependence, unspecified, uncomplicated: F17.200

## 2016-09-23 MED ORDER — FLUTICASONE PROPIONATE 50 MCG/ACT NA SUSP: 1.0000 | Freq: Every day | NASAL | 3 refills | Status: DC | PRN

## 2016-09-23 MED ORDER — ALBUTEROL SULFATE HFA 108 (90 BASE) MCG/ACT IN AERS: 1.0000 | INHALATION_SPRAY | RESPIRATORY_TRACT | 0 refills | Status: DC | PRN

## 2016-09-23 MED ORDER — VARENICLINE TARTRATE 0.5 MG X 11 & 1 MG X 42 PO MISC: ORAL | 0 refills | Status: DC

## 2016-09-23 MED ORDER — IPRATROPIUM-ALBUTEROL 0.5-2.5 (3) MG/3ML IN SOLN: 3.0000 mL | Freq: Four times a day (QID) | RESPIRATORY_TRACT | Status: DC

## 2016-09-23 MED ORDER — BENZONATATE 200 MG PO CAPS: 200.0000 mg | ORAL_CAPSULE | Freq: Three times a day (TID) | ORAL | 0 refills | Status: DC | PRN

## 2016-09-23 NOTE — Patient Instructions (Addendum)
Return in the next 2-3 weeks to see Dr. Darra Frank for Chantix follow-up  Use Albuterol inhaler as needed for shortness of breath or bronchospasm  Discontinue Mucinex DM and any cough medicines with Dextromethorphan. This may interact with your Effexor. You can take regular Mucinex that only contains Guaifenesin  Call or return as needed for new or worsening symptoms Acute Bronchitis, Adult Acute bronchitis is sudden (acute) swelling of the air tubes (bronchi) in the lungs. Acute bronchitis causes these tubes to fill with mucus, which can make it hard to breathe. It can also cause coughing or wheezing. In adults, acute bronchitis usually goes away within 2 weeks. A cough caused by bronchitis may last up to 3 weeks. Smoking, allergies, and asthma can make the condition worse. Repeated episodes of bronchitis may cause further lung problems, such as chronic obstructive pulmonary disease (COPD). What are the causes? This condition can be caused by germs and by substances that irritate the lungs, including:  Cold and flu viruses. This condition is most often caused by the same virus that causes a cold.  Bacteria.  Exposure to tobacco smoke, dust, fumes, and air pollution. What increases the risk? This condition is more likely to develop in people who:  Have close contact with someone with acute bronchitis.  Are exposed to lung irritants, such as tobacco smoke, dust, fumes, and vapors.  Have a weak immune system.  Have a respiratory condition such as asthma. What are the signs or symptoms? Symptoms of this condition include:  A cough.  Coughing up clear, yellow, or green mucus.  Wheezing.  Chest congestion.  Shortness of breath.  A fever.  Body aches.  Chills.  A sore throat. How is this diagnosed? This condition is usually diagnosed with a physical exam. During the exam, your health care provider may order tests, such as chest X-rays, to rule out other conditions. He or  she may also:  Test a sample of your mucus for bacterial infection.  Check the level of oxygen in your blood. This is done to check for pneumonia.  Do a chest X-ray or lung function testing to rule out pneumonia and other conditions.  Perform blood tests. Your health care provider will also ask about your symptoms and medical history. How is this treated? Most cases of acute bronchitis clear up over time without treatment. Your health care provider may recommend:  Drinking more fluids. Drinking more makes your mucus thinner, which may make it easier to breathe.  Taking a medicine for a fever or cough.  Taking an antibiotic medicine.  Using an inhaler to help improve shortness of breath and to control a cough.  Using a cool mist vaporizer or humidifier to make it easier to breathe. Follow these instructions at home: Medicines  Take over-the-counter and prescription medicines only as told by your health care provider.  If you were prescribed an antibiotic, take it as told by your health care provider. Do not stop taking the antibiotic even if you start to feel better. General instructions  Get plenty of rest.  Drink enough fluids to keep your urine clear or pale yellow.  Avoid smoking and secondhand smoke. Exposure to cigarette smoke or irritating chemicals will make bronchitis worse. If you smoke and you need help quitting, ask your health care provider. Quitting smoking will help your lungs heal faster.  Use an inhaler, cool mist vaporizer, or humidifier as told by your health care provider.  Keep all follow-up visits as told by  your health care provider. This is important. How is this prevented? To lower your risk of getting this condition again:  Wash your hands often with soap and water. If soap and water are not available, use hand sanitizer.  Avoid contact with people who have cold symptoms.  Try not to touch your hands to your mouth, nose, or eyes.  Make sure to  get the flu shot every year. Contact a health care provider if:  Your symptoms do not improve in 2 weeks of treatment. Get help right away if:  You cough up blood.  You have chest pain.  You have severe shortness of breath.  You become dehydrated.  You faint or keep feeling like you are going to faint.  You keep vomiting.  You have a severe headache.  Your fever or chills gets worse. This information is not intended to replace advice given to you by your health care provider. Make sure you discuss any questions you have with your health care provider. Document Released: 10/15/2004 Document Revised: 04/01/2016 Document Reviewed: 02/26/2016 Elsevier Interactive Patient Education  2017 [employer].

## 2016-09-23 NOTE — Progress Notes (Signed)
HPI:                                                                Kelli Frank is a 56 y.o. female who [facility] today for cough  Patient presents with productive cough x 1 week. Cough is worse with cold air. Endorses mild shortness of breath with exertion (climbing stairs) and chest tightness. She has been taking Mucinex DM and Benadryl with some relief.  History significant for seasonal allergic rhinitis. No history of asthma, COPD. Patient states she does have a history of acute bronchitis. Has started smoking again 5-6 cigarettes/day. She is asking for Chantix today.  Review of Systems  Constitutional: Negative for chills and fever.  HENT: Positive for congestion and sinus pain. Negative for ear pain, nosebleeds and sore throat.        Ear fullness  Respiratory: Positive for cough and sputum production.        Chest tightness, esp with cough  Cardiovascular: Negative for chest pain and palpitations.  Musculoskeletal: Negative for joint pain and myalgias.  Skin: Negative for rash.    Health Maintenance Health Maintenance  Topic Date Due  . Hepatitis C Screening  23-Apr-1961  . HIV Screening  02/23/1977  . INFLUENZA VACCINE  04/21/2016  . MAMMOGRAM  12/19/2016  . PAP SMEAR  05/21/2017  . COLONOSCOPY  06/19/2017    Past Medical History:  Diagnosis Date  . Ovarian cyst, left   . Smoker   . VIN III (vulvar intraepithelial neoplasia III)   . 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 TONSILLECTOMY  1970  . TUBAL LIGATION  2009   bilateral  . VULVAR LESION REMOVAL  06/23/2011   Social History  Substance Use Topics  . Smoking status: Current Every Day Smoker    Types: Cigarettes  . Smokeless tobacco: Never Used     Comment: hx of 25 packyears, now smoking 5-6 cig/day  . Alcohol use Yes     Comment: occasional   family history includes  Heart attack in her father; Hyperlipidemia in her brother and father; Hypertension in her father; Thyroid disease in her brother.  ROS: negative except as noted in the HPI  Medications: Current Outpatient Prescriptions  Medication Sig Dispense Refill  . meloxicam (MOBIC) 15 MG tablet One tab PO qAM with breakfast for 2 weeks, then daily prn pain. 30 tablet 3  . venlafaxine XR (EFFEXOR-XR) 37.5 MG 24 hr capsule TAKE 1 CAPSULE BY MOUTH EVERY DAY 90 capsule 1  . venlafaxine XR (EFFEXOR-XR) 75 MG 24 hr capsule TAKE 1 CAPSULE(75 MG) BY MOUTH DAILY WITH BREAKFAST 90 capsule 1  . albuterol (PROVENTIL HFA;VENTOLIN HFA) 108 (90 Base) MCG/ACT inhaler Inhale 1 puff into the lungs every 4 (four) hours as needed for wheezing. 1 Inhaler 0  . benzonatate (TESSALON) 200 MG capsule Take 1 capsule (200 mg total) by mouth 3 (three) times daily as needed for cough. 45 capsule 0  . fluticasone (FLONASE) 50 MCG/ACT nasal spray Place 1 spray into both nostrils daily as needed for allergies or rhinitis. 16 g 3  . varenicline (CHANTIX STARTING MONTH PAK) 0.5 MG X 11 & 1 MG  X 42 tablet Take one 0.5mg  tablet by mouth once daily for 3 days, then increase to one 0.5mg  tablet twice daily for 3 days, then increase to one 1mg  tablet twice daily. 53 tablet 0   Current Facility-Administered Medications  Medication Dose Route Frequency Provider Last Rate Last Dose  . ipratropium-albuterol (DUONEB) 0.5-2.5 (3) MG/3ML nebulizer solution 3 mL  3 mL Nebulization Q6H Carlis Stable, PA-C       Allergies  Allergen Reactions  . Penicillins   . Propoxyphene N-Acetaminophen   . Tetanus-Diphtheria Toxoids Td        Objective:  BP (!) 151/80   Pulse 78   Temp 97.6 F (36.4 C) (Oral)   Wt 145 lb (65.8 kg)   LMP 06/01/2012   BMI 27.40 kg/m  Gen: well-groomed, cooperative, not ill-appearing, no distress HEENT: normal conjunctiva, TM's clear, nasal mucosa edematous, oropharynx clear, moist mucus membranes, there is  cobblestoning of the posterior tongue Lungs: Normal work of breathing, no accessory muscle use, diffuse expiratory wheezes, no rales or rhonchi Heart: Normal rate, regular rhythm, s1 and s2 distinct, no murmurs, clicks or rubs appreciated on this exam Skin: warm and dry, no rashes or lesions on exposed skin, no cyanosis  No results found for this or any previous visit (from the past 72 hour(s)). No results found.    Assessment and Plan: 56 y.o. female with   1. Acute bronchitis, unspecified organism - DuoNeb given in clinic today. Patient felt improved. Wheezing improved, but not entirely resolved.  - Ordered CXR to rule-out pneumonia or lung pathology given patient is a smoker - instructed to discontinue Mucinex DM due to potential reaction between Effexor and Dextromethorphan. Instructed that she could take Guaifenesin for chest congestion - albuterol (PROVENTIL HFA;VENTOLIN HFA) 108 (90 Base) MCG/ACT inhaler; Inhale 1 puff into the lungs every 4 (four) hours as needed for wheezing.  Dispense: 1 Inhaler; Refill: 0 - benzonatate (TESSALON) 200 MG capsule; Take 1 capsule (200 mg total) by mouth 3 (three) times daily as needed for cough.  Dispense: 45 capsule; Refill: 0  Allergic Rhinitis - fluticasone (FLONASE) 50 MCG/ACT nasal spray; Place 1 spray into both nostrils daily as needed for allergies or rhinitis.  Dispense: 16 g; Refill: 3  Tobacco dependence - DG Chest 2 View - varenicline (CHANTIX STARTING MONTH PAK) 0.5 MG X 11 & 1 MG X 42 tablet; Take one 0.5mg  tablet by mouth once daily for 3 days, then increase to one 0.5mg  tablet twice daily for 3 days, then increase to one 1mg  tablet twice daily.  Dispense: 53 tablet; Refill: 0 - instructed to followup with PCP in 2-3 weeks for continued management of smoking cessation with Chantix  Patient education and anticipatory guidance given Patient agrees with treatment plan Follow-up as needed if symptoms worsen or fail to  improve  Levonne Hubert PA-C

## 2016-10-06 ENCOUNTER — Ambulatory Visit (INDEPENDENT_AMBULATORY_CARE_PROVIDER_SITE_OTHER): Payer: BC Managed Care – PPO | Admitting: Family Medicine

## 2016-10-06 ENCOUNTER — Encounter: Payer: Self-pay | Admitting: Family Medicine

## 2016-10-06 VITALS — BP 137/83 | HR 70 | Temp 98.4°F | Wt 152.0 lb

## 2016-10-06 DIAGNOSIS — R112 Nausea with vomiting, unspecified: Secondary | ICD-10-CM

## 2016-10-06 DIAGNOSIS — M654 Radial styloid tenosynovitis [de Quervain]: Secondary | ICD-10-CM | POA: Diagnosis not present

## 2016-10-06 DIAGNOSIS — F17200 Nicotine dependence, unspecified, uncomplicated: Secondary | ICD-10-CM

## 2016-10-06 DIAGNOSIS — J209 Acute bronchitis, unspecified: Secondary | ICD-10-CM | POA: Diagnosis not present

## 2016-10-06 DIAGNOSIS — N951 Menopausal and female climacteric states: Secondary | ICD-10-CM | POA: Diagnosis not present

## 2016-10-06 MED ORDER — VARENICLINE TARTRATE 1 MG PO TABS: 1.0000 mg | ORAL_TABLET | Freq: Two times a day (BID) | ORAL | 1 refills | Status: DC

## 2016-10-06 MED ORDER — MELOXICAM 15 MG PO TABS: ORAL_TABLET | ORAL | 3 refills | Status: DC

## 2016-10-06 NOTE — Progress Notes (Signed)
Subjective:    CC: Chantix and bronchitis.   HPI:  Kelli Frank is here today for follow-up of her bronchitis. She was seen in the office about 2 weeks ago and diagnosed with bronchitis. At that point she already had a cough for about a week. She also is a smoker and was started on the starting month of Chantix.  She found a coupon for it and hasn't picked up yet but plans to start. She says her husband is not quite ready to quit. But she plans to go ahead and try again. She was able to quit before and has used Chantix before in the past without any side effects or problems.  He did want to let me set know that she had an episode of sudden nausea this morning. She said she woke up suddenly felt nauseated vomited about 4 times within about a 30 minute period and then felt fine was able to go to work. She said she did not eat anything out of the norm. Infection or has been the same thing last night. She had not taken any new medications in fact she had not taken any medications at that point. She is felt fine the rest of the day.  Hot Flashes-she's doing really well on the Effexor. She says it's quite effective in keeping the sweating under control.  Review of Systems: No fevers, chills, night sweats, weight loss, chest pain, or shortness of breath.   LMP 06/01/2012     Allergies  Allergen Reactions  . Penicillins   . Propoxyphene N-Acetaminophen   . Tetanus-Diphtheria Toxoids Td     Past Medical History:  Diagnosis Date  . Ovarian cyst, left   . Smoker   . VIN III (vulvar intraepithelial neoplasia III)   . 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 TONSILLECTOMY  1970  . TUBAL LIGATION  2009   bilateral  . VULVAR LESION REMOVAL  06/23/2011    Social History   Social History  . Marital status: Single    Spouse name: N/A  . Number of children: N/A  . Years of education: N/A   Occupational History  . Not on  file.   Social History Main Topics  . Smoking status: Current Every Day Smoker    Types: Cigarettes  . Smokeless tobacco: Never Used     Comment: hx of 25 packyears, now smoking 5-6 cig/day  . Alcohol use Yes     Comment: occasional  . Drug use: No  . Sexual activity: Not Currently    Partners: Male   Other Topics Concern  . Not on file   Social History Narrative  . No narrative on file    Family History  Problem Relation Age of Onset  . Heart attack Father   . Hypertension Father   . Hyperlipidemia Father   . Hyperlipidemia Brother   . Thyroid disease Brother   . Cancer      grandfather/colorectal    Outpatient Encounter Prescriptions as of 10/06/2016  Medication Sig  . albuterol (PROVENTIL HFA;VENTOLIN HFA) 108 (90 Base) MCG/ACT inhaler Inhale 1 puff into the lungs every 4 (four) hours as needed for wheezing.  . fluticasone (FLONASE) 50 MCG/ACT nasal spray Place 1 spray into both nostrils daily as needed for allergies or rhinitis.  . meloxicam (MOBIC) 15 MG tablet One tab PO qAM with breakfast for 2 weeks, then daily prn pain.  Marland Kitchen venlafaxine XR (EFFEXOR-XR)  37.5 MG 24 hr capsule TAKE 1 CAPSULE BY MOUTH EVERY DAY  . [DISCONTINUED] meloxicam (MOBIC) 15 MG tablet One tab PO qAM with breakfast for 2 weeks, then daily prn pain.  . [DISCONTINUED] venlafaxine XR (EFFEXOR-XR) 75 MG 24 hr capsule TAKE 1 CAPSULE(75 MG) BY MOUTH DAILY WITH BREAKFAST  . varenicline (CHANTIX CONTINUING MONTH PAK) 1 MG tablet Take 1 tablet (1 mg total) by mouth 2 (two) times daily.  . [DISCONTINUED] benzonatate (TESSALON) 200 MG capsule Take 1 capsule (200 mg total) by mouth 3 (three) times daily as needed for cough.  . [DISCONTINUED] varenicline (CHANTIX STARTING MONTH PAK) 0.5 MG X 11 & 1 MG X 42 tablet Take one 0.5mg  tablet by mouth once daily for 3 days, then increase to one 0.5mg  tablet twice daily for 3 days, then increase to one 1mg  tablet twice daily.   Facility-Administered Encounter  Medications as of 10/06/2016  Medication  . ipratropium-albuterol (DUONEB) 0.5-2.5 (3) MG/3ML nebulizer solution 3 mL       Objective:    General: Well Developed, well nourished, and in no acute distress.  Neuro: Alert and oriented x3, extra-ocular muscles intact, sensation grossly intact.  HEENT: Normocephalic, atraumatic  Skin: Warm and dry, no rashes. Cardiac: Regular rate and rhythm, no murmurs rubs or gallops, no lower extremity edema.  Respiratory: Clear to auscultation bilaterally. Not using accessory muscles, speaking in full sentences.   Impression and Recommendations:   Acute bronchitis-chest is completely clear today. She would be a great candidate to come in for evaluation for COPD she's been a long time smoker and she's   over the age of 56. Next  Tobacco abuse-one ahead and give her the prescription for the continuing pack of Chantix since she's done well with it before.   Hot flashes-continue current regimen with low-dose Effexor.  N/V- unclear etiology. It sounds like it was very brief and she's actually feeling completely fine today. Unsure if viral or if she ate something that did not sit well with her. Certainly if symptoms recur them please let me know and will workup additionally.  She also needed a refill on her meloxicam today.

## 2016-10-07 ENCOUNTER — Ambulatory Visit: Payer: BC Managed Care – PPO | Admitting: Family Medicine

## 2017-03-02 ENCOUNTER — Other Ambulatory Visit: Payer: Self-pay

## 2017-03-02 DIAGNOSIS — R232 Flushing: Secondary | ICD-10-CM

## 2017-03-02 MED ORDER — VENLAFAXINE HCL ER 37.5 MG PO CP24: ORAL_CAPSULE | ORAL | 0 refills | Status: DC

## 2017-03-02 MED ORDER — VENLAFAXINE HCL ER 75 MG PO CP24: ORAL_CAPSULE | ORAL | 0 refills | Status: DC

## 2017-03-02 NOTE — Progress Notes (Signed)
Pt needs supply to last until upcoming appt with PCP.

## 2017-03-09 ENCOUNTER — Telehealth: Payer: Self-pay | Admitting: *Deleted

## 2017-03-09 DIAGNOSIS — E785 Hyperlipidemia, unspecified: Secondary | ICD-10-CM

## 2017-03-09 DIAGNOSIS — R7301 Impaired fasting glucose: Secondary | ICD-10-CM

## 2017-03-09 NOTE — Telephone Encounter (Signed)
Pt wanted to have labs done prior to OV. Labs printed and faxed.Loralee PacasBarkley, Clarann Helvey KingfieldLynetta

## 2017-03-15 ENCOUNTER — Ambulatory Visit (INDEPENDENT_AMBULATORY_CARE_PROVIDER_SITE_OTHER): Payer: BC Managed Care – PPO | Admitting: Family Medicine

## 2017-03-15 ENCOUNTER — Encounter: Payer: Self-pay | Admitting: Family Medicine

## 2017-03-15 VITALS — BP 143/84 | HR 86 | Ht 61.0 in | Wt 148.0 lb

## 2017-03-15 DIAGNOSIS — R7301 Impaired fasting glucose: Secondary | ICD-10-CM | POA: Diagnosis not present

## 2017-03-15 DIAGNOSIS — Z1231 Encounter for screening mammogram for malignant neoplasm of breast: Secondary | ICD-10-CM | POA: Diagnosis not present

## 2017-03-15 DIAGNOSIS — Z87891 Personal history of nicotine dependence: Secondary | ICD-10-CM

## 2017-03-15 DIAGNOSIS — R232 Flushing: Secondary | ICD-10-CM | POA: Diagnosis not present

## 2017-03-15 DIAGNOSIS — Z1239 Encounter for other screening for malignant neoplasm of breast: Secondary | ICD-10-CM

## 2017-03-15 LAB — POCT GLYCOSYLATED HEMOGLOBIN (HGB A1C): Hemoglobin A1C: 6

## 2017-03-15 MED ORDER — VENLAFAXINE HCL ER 37.5 MG PO CP24: ORAL_CAPSULE | ORAL | 1 refills | Status: DC

## 2017-03-15 MED ORDER — VENLAFAXINE HCL ER 75 MG PO CP24: ORAL_CAPSULE | ORAL | 1 refills | Status: DC

## 2017-03-15 NOTE — Progress Notes (Signed)
Subjective:    CC: IFG   HPI:  Impaired fasting glucose-no increased thirst or urination. No symptoms consistent with hypoglycemia.  Smoking cesstaion - [place]ant[place].    Hot flashes - she is on Effexor and doing well. She tried to wean down to 75mg  and she says after 3 days had to  Go back up on dose.  Needs refills today.  Still having temptations to smoke.     Past medical history, Surgical history, Family history not pertinant except as noted below, Social history, Allergies, and medications have been entered into the medical record, reviewed, and corrections made.   Review of Systems: No fevers, chills, night sweats, weight loss, chest pain, or shortness of breath.   Objective:    General: Well Developed, well nourished, and in no acute distress.  Neuro: Alert and oriented x3, extra-ocular muscles intact, sensation grossly intact.  HEENT: Normocephalic, atraumatic  Skin: Warm and dry, no rashes. Cardiac: Regular rate and rhythm, no murmurs rubs or gallops, no lower extremity edema.  Respiratory: Clear to auscultation bilaterally. Not using accessory muscles, speaking in full sentences.   Impression and Recommendations:    IFG - Well controlled. Continue current regimen. Follow up in  6 moths.  Lab Results  Component Value Date   HGBA1C 6.0 03/15/2017   Smoking cesstaion - she has quit for 3 months.  Doing well. Encouraged her to just continue to work on it.  Hot flashes - doing well on the Effexor.   RF med.

## 2017-04-06 ENCOUNTER — Telehealth: Payer: Self-pay | Admitting: Family Medicine

## 2017-04-07 NOTE — Telephone Encounter (Signed)
x

## 2017-04-09 ENCOUNTER — Ambulatory Visit (INDEPENDENT_AMBULATORY_CARE_PROVIDER_SITE_OTHER): Payer: BC Managed Care – PPO

## 2017-04-09 DIAGNOSIS — Z1231 Encounter for screening mammogram for malignant neoplasm of breast: Secondary | ICD-10-CM

## 2017-04-10 LAB — LIPID PANEL W/REFLEX DIRECT LDL
CHOL/HDL RATIO: 5.7 ratio — AB (ref <5.0)
CHOLESTEROL: 292 mg/dL — AB (ref <200)
HDL: 51 mg/dL (ref >50)
LDL-Cholesterol: 202 mg/dL — ABNORMAL HIGH
Non-HDL Cholesterol (Calc): 241 mg/dL — ABNORMAL HIGH (ref <130)
Triglycerides: 209 mg/dL — ABNORMAL HIGH (ref <150)

## 2017-04-10 LAB — COMPLETE METABOLIC PANEL WITH GFR
ALBUMIN: 4.2 g/dL (ref 3.6–5.1)
ALK PHOS: 114 U/L (ref 33–130)
ALT: 17 U/L (ref 6–29)
AST: 20 U/L (ref 10–35)
BUN: 15 mg/dL (ref 7–25)
CALCIUM: 9.4 mg/dL (ref 8.6–10.4)
CO2: 21 mmol/L (ref 20–31)
Chloride: 106 mmol/L (ref 98–110)
Creat: 0.73 mg/dL (ref 0.50–1.05)
GFR, Est African American: >89 mL/min (ref >=60)
Glucose, Bld: 132 mg/dL — ABNORMAL HIGH (ref 65–99)
Potassium: 4.1 mmol/L (ref 3.5–5.3)
Sodium: 140 mmol/L (ref 135–146)
Total Bilirubin: 0.4 mg/dL (ref 0.2–1.2)
Total Protein: 6.6 g/dL (ref 6.1–8.1)

## 2017-04-10 LAB — HEMOGLOBIN A1C
HEMOGLOBIN A1C: 5.9 % — AB (ref <5.7)
Mean Plasma Glucose: 123 mg/dL

## 2017-04-19 ENCOUNTER — Ambulatory Visit (INDEPENDENT_AMBULATORY_CARE_PROVIDER_SITE_OTHER): Payer: BC Managed Care – PPO | Admitting: Sports Medicine

## 2017-04-19 ENCOUNTER — Encounter: Payer: Self-pay | Admitting: Sports Medicine

## 2017-04-19 ENCOUNTER — Ambulatory Visit (INDEPENDENT_AMBULATORY_CARE_PROVIDER_SITE_OTHER): Payer: BC Managed Care – PPO

## 2017-04-19 DIAGNOSIS — M79672 Pain in left foot: Secondary | ICD-10-CM

## 2017-04-19 NOTE — Progress Notes (Signed)
   Subjective:    I'm seeing this patient as a consultation for: Dr. Nani Gasseratherine Metheney.  CC: foot pain  HPI: Patient reports left foot pain for 3-4 weeks. She characterizes the pain as achy and constant with walking. It is localized to the anterior process of the calcaneus. It was initially 6/10 in severity but has since decreased to 2/10. Patient does not recall any inciting events. She denies any numbness or tingling. Pain is worse with shoes and walking. Patient reports that pain is completely resolved with meloxicam. Patient states that foot feels more stiff in the morning.  Past medical history, Surgical history, Family history not pertinant except as noted below, Social history, Allergies, and medications have been entered into the medical record, reviewed, and no changes needed.   Review of Systems: No headache, visual changes, nausea, vomiting, diarrhea, constipation, dizziness, abdominal pain, skin rash, fevers, chills, night sweats, weight loss, swollen lymph nodes, body aches, joint swelling, muscle aches, chest pain, shortness of breath, mood changes, visual or auditory hallucinations.   Objective:   General: Well Developed, well nourished, and in no acute distress.  Neuro:  Extra-ocular muscles intact, able to move all 4 extremities, sensation grossly intact.  Deep tendon reflexes tested were normal. Psych: Alert and oriented, mood congruent with affect. ENT:  Ears and nose appear unremarkable.  Hearing grossly normal. Neck: Unremarkable overall appearance, trachea midline.  No visible thyroid enlargement. Eyes: Conjunctivae and lids appear unremarkable.  Pupils equal and round. Skin: Warm and dry, no rashes noted.  Cardiovascular: Pulses palpable, no extremity edema. MSK: Left foot: No gross deformity or swelling on inspection No tenderness to palpation Range of motion is full with dorsiflexion, plantarflexion, inversion and eversion Strength is 5/5 with dorsiflexion and  plantarflexion   Impression and Recommendations:   This case required medical decision making of moderate complexity.  56 year-old female presenting with left foot pain. 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 Patient will undergo left foot x-ray. The importance of wearing comfortable shoes was discussed with patient. She will continue using meloxicam and wearing comfortable shoes and continue to monitor for symptom resolution. She will follow-up in 1 month to reassess.

## 2017-04-19 NOTE — Assessment & Plan Note (Signed)
Pain over the anterior process of the calcaneus, likely intertarsal joint. Supportive shoes, continue meloxicam, x-rays. Return in one month, MRI if no better.

## 2017-10-11 ENCOUNTER — Other Ambulatory Visit: Payer: Self-pay | Admitting: Family Medicine

## 2017-10-11 DIAGNOSIS — R232 Flushing: Secondary | ICD-10-CM

## 2017-11-04 ENCOUNTER — Encounter: Payer: Self-pay | Admitting: Family Medicine

## 2017-11-04 ENCOUNTER — Ambulatory Visit: Payer: BC Managed Care – PPO | Admitting: Family Medicine

## 2017-11-04 VITALS — BP 154/73 | HR 87 | Ht 61.0 in | Wt 145.0 lb

## 2017-11-04 DIAGNOSIS — R7301 Impaired fasting glucose: Secondary | ICD-10-CM | POA: Diagnosis not present

## 2017-11-04 DIAGNOSIS — F17200 Nicotine dependence, unspecified, uncomplicated: Secondary | ICD-10-CM

## 2017-11-04 DIAGNOSIS — Z1211 Encounter for screening for malignant neoplasm of colon: Secondary | ICD-10-CM | POA: Diagnosis not present

## 2017-11-04 DIAGNOSIS — Z720 Tobacco use: Secondary | ICD-10-CM

## 2017-11-04 DIAGNOSIS — R232 Flushing: Secondary | ICD-10-CM

## 2017-11-04 DIAGNOSIS — E785 Hyperlipidemia, unspecified: Secondary | ICD-10-CM

## 2017-11-04 LAB — POCT GLYCOSYLATED HEMOGLOBIN (HGB A1C): HEMOGLOBIN A1C: 6.2

## 2017-11-04 MED ORDER — VENLAFAXINE HCL ER 75 MG PO CP24: ORAL_CAPSULE | ORAL | 1 refills | Status: DC

## 2017-11-04 MED ORDER — VENLAFAXINE HCL ER 37.5 MG PO CP24: ORAL_CAPSULE | ORAL | 1 refills | Status: DC

## 2017-11-04 MED ORDER — VARENICLINE TARTRATE 0.5 MG X 11 & 1 MG X 42 PO MISC: ORAL | 0 refills | Status: DC

## 2017-11-04 NOTE — Progress Notes (Signed)
Subjective:    Patient ID: Kelli Frank, female    DOB: 01/18/1961, 57 y.o.   MRN: [id number]  HPI 1465-month follow-up for impaired fasting glucose.  Impaired fasting glucose-no increased thirst or urination. No symptoms consistent with hypoglycemia.  She is also interested in quitting smoking.  She has tried  Chantix in the past.  She was able to quit smoking for about 5 months last year and then separated from her boyfriend of 8 years and started smoking again.  Pt reports that she started smoking again and would like to restart chantix , she will be traveling to [place]  Follow-up hot flashes-she is currently on Effexor.  She did try decreasing her dose down to 75 mg daily and she said she did well for about a week but in the hot flashes came back and started waking up with soaking wet hair and so she added back to 37.5 mg.  Hyperlipidemia-last LDL was 202 and had recommended a trial of a statin.  He opted out at that time.   Review of Systems  BP (!) 154/73   Pulse 87   Ht 5\' 1"  (1.549 m)   Wt 145 lb (65.8 kg)   LMP 06/01/2012   SpO2 97%   BMI 27.40 kg/m     Allergies  Allergen Reactions  . Penicillins   . Propoxyphene N-Acetaminophen   . Tetanus-Diphtheria Toxoids Td     Past Medical History:  Diagnosis Date  . Ovarian cyst, left   . Smoker   . VIN III (vulvar intraepithelial neoplasia III)   . 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 TONSILLECTOMY  1970  . TUBAL LIGATION  2009   bilateral  . VULVAR LESION REMOVAL  06/23/2011    Social History   Socioeconomic History  . Marital status: Single    Spouse name: Not on file  . Number of children: Not on file  . Years of education: Not on file  . Highest education level: Not on file  Social Needs  . Financial resource strain: Not on file  . Food insecurity - worry: Not on file  . Food insecurity - inability: Not on file  .  Transportation needs - medical: Not on file  . Transportation needs - non-medical: Not on file  Occupational History  . Not on file  Tobacco Use  . Smoking status: Former Smoker    Types: Cigarettes  . Smokeless tobacco: Never Used  . Tobacco comment: hx of 25 packyears, now smoking 5-6 cig/day  Substance and Sexual Activity  . Alcohol use: Yes    Comment: occasional  . Drug use: No  . Sexual activity: Not Currently    Partners: Male  Other Topics Concern  . Not on file  Social History Narrative  . Not on file    Family History  Problem Relation Age of Onset  . Heart attack Father   . Hypertension Father   . Hyperlipidemia Father   . Hyperlipidemia Brother   . Thyroid disease Brother   . Cancer Unknown        grandfather/colorectal    Outpatient Encounter Medications as of 11/04/2017  Medication Sig  . AMBULATORY NON FORMULARY MEDICATION Take 8 capsules by mouth daily. Medication Name: juice plus  . meloxicam (MOBIC) 15 MG tablet One tab PO qAM with breakfast for 2 weeks, then daily prn pain.  Marland Kitchen. venlafaxine XR (EFFEXOR-XR) 37.5 MG  24 hr capsule TAKE 1 CAPSULE BY MOUTH EVERY DAY.  Marland Kitchen venlafaxine XR (EFFEXOR-XR) 75 MG 24 hr capsule TAKE 1 CAPSULE(75 MG) BY MOUTH DAILY WITH BREAKFAST  . [DISCONTINUED] venlafaxine XR (EFFEXOR-XR) 37.5 MG 24 hr capsule TAKE 1 CAPSULE BY MOUTH EVERY DAY.LAST REFILL. 30 DAY SUPPLY GIVEN. CALL OFFICE TO SCHEDULE AN APPOINTMENT  . [DISCONTINUED] venlafaxine XR (EFFEXOR-XR) 75 MG 24 hr capsule TAKE 1 CAPSULE(75 MG) BY MOUTH DAILY WITH BREAKFASTLAST REFILL. 30 DAY SUPPLY GIVEN. CALL OFFICE TO SCHEDULE AN APPOINTMENT  . varenicline (CHANTIX STARTING MONTH PAK) 0.5 MG X 11 & 1 MG X 42 tablet Take one 0.5 mg tablet by mouth once daily for 3 days, then increase to one 0.5 mg tablet twice daily for 4 days, then increase to one 1 mg tablet twice daily.  . [DISCONTINUED] albuterol (PROVENTIL HFA;VENTOLIN HFA) 108 (90 Base) MCG/ACT inhaler Inhale 1 puff into the  lungs every 4 (four) hours as needed for wheezing.  . [DISCONTINUED] fluticasone (FLONASE) 50 MCG/ACT nasal spray Place 1 spray into both nostrils daily as needed for allergies or rhinitis.  . [DISCONTINUED] varenicline (CHANTIX CONTINUING MONTH PAK) 1 MG tablet Take 1 tablet (1 mg total) by mouth 2 (two) times daily.   No facility-administered encounter medications on file as of 11/04/2017.          Objective:   Physical Exam  Constitutional: She is oriented to person, place, and time. She appears well-developed and well-nourished.  HENT:  Head: Normocephalic and atraumatic.  Cardiovascular: Normal rate, regular rhythm and normal heart sounds.  Pulmonary/Chest: Effort normal and breath sounds normal.  Neurological: She is alert and oriented to person, place, and time.  Skin: Skin is warm and dry.  Psychiatric: She has a normal mood and affect. Her behavior is normal.          Assessment & Plan:  Impaired fasting glucose- Well controlled.  Though A1c did go up to 6.2.  Continue current regimen. Follow up in  4 monhts.   We discussed strategies around low-carb diet and low sugar diet and increasing vegetables and proteins.  Discussed that she could find some good information on on diabetes.org.  Also work on increasing activity.  Tobacco abuse-we will try the Chantix again.  Starter pack sent to the pharmacy and then when she is done with that she can call or have the pharmacy call for the continuing pack for a total of 3 months.  Hot flashes-well controlled on the Effexor.  Medication refilled.  Can try taking the 37.5 mg every other day along with a 75 daily and just see if that works well.  Hyperlipidemia-due to recheck lipids at next office visit.

## 2017-11-11 ENCOUNTER — Other Ambulatory Visit: Payer: Self-pay | Admitting: Family Medicine

## 2017-11-11 DIAGNOSIS — F17200 Nicotine dependence, unspecified, uncomplicated: Secondary | ICD-10-CM

## 2018-03-04 ENCOUNTER — Encounter: Payer: Self-pay | Admitting: Family Medicine

## 2018-03-04 ENCOUNTER — Ambulatory Visit: Payer: BC Managed Care – PPO | Admitting: Family Medicine

## 2018-03-04 VITALS — BP 132/77 | HR 75 | Ht 61.0 in | Wt 147.0 lb

## 2018-03-04 DIAGNOSIS — R7301 Impaired fasting glucose: Secondary | ICD-10-CM | POA: Diagnosis not present

## 2018-03-04 DIAGNOSIS — E782 Mixed hyperlipidemia: Secondary | ICD-10-CM | POA: Diagnosis not present

## 2018-03-04 DIAGNOSIS — F17200 Nicotine dependence, unspecified, uncomplicated: Secondary | ICD-10-CM

## 2018-03-04 LAB — POCT GLYCOSYLATED HEMOGLOBIN (HGB A1C): HEMOGLOBIN A1C: 6.1 % — AB (ref 4.0–5.6)

## 2018-03-04 MED ORDER — VARENICLINE TARTRATE 1 MG PO TABS: 1.0000 mg | ORAL_TABLET | Freq: Two times a day (BID) | ORAL | 1 refills | Status: DC

## 2018-03-04 MED ORDER — VARENICLINE TARTRATE 0.5 MG X 11 & 1 MG X 42 PO MISC: ORAL | 0 refills | Status: DC

## 2018-03-04 NOTE — Progress Notes (Signed)
Subjective:    CC: glucose and cholesterol  HPI:  Impaired fasting glucose-no increased thirst or urination. No symptoms consistent with hypoglycemia.  I am seeing her back a little sooner this time instead of 6 months I am seeing her at 4 months because her A1c did go up to 6.2.  She has really a lot of made a lot of major changes.  She is cut out soda and switch to water.  She will occasionally drinks sweet tea but uses Stevia and night.  She is now walking 2 to 4 miles per day and starting to do some Pilates.  She has stopped eating rice and switched to cauliflower rice.  Hyperlipidemia-last LDL was elevated at 202.  Based on current guidelines for cholesterol treatment we had recommended that she start a statin.  Patient declined.  Tob abuse - she would like to try quitting smoking again.  Last time I saw her before her trip to [place] she started the medication but unfortunately started smoking again.  She was never able to get the maintenance pack through the pharmacy.  She is down to about 1 cigarette/day.  Past medical history, Surgical history, Family history not pertinant except as noted below, Social history, Allergies, and medications have been entered into the medical record, reviewed, and corrections made.   Review of Systems: No fevers, chills, night sweats, weight loss, chest pain, or shortness of breath.   Objective:    General: Well Developed, well nourished, and in no acute distress.  Neuro: Alert and oriented x3, extra-ocular muscles intact, sensation grossly intact.  HEENT: Normocephalic, atraumatic  Skin: Warm and dry, no rashes. Cardiac: Regular rate and rhythm, no murmurs rubs or gallops, no lower extremity edema.  Respiratory: Clear to auscultation bilaterally. Not using accessory muscles, speaking in full sentences.   Impression and Recommendations:    IFG - Well controlled. Continue current regimen. Follow up in 4 months.  Her blood pressure actually looks great  today is a little elevated last time so that is fantastic.  Really made some great dietary changes so just encouraged her to keep with that and follow-up in about 4 to 6 months.  Hyperlipidemia-due to recheck lipids again.  Last LDL uncontrolled and recommend statin.  This along with her tobacco use puts her at high risk for cardiovascular disease over the next 10 years.  Tob abuse  - We will start the Chantix starter pack again.  I went ahead and gave her the prescription for the maintenance pack so that way she does not have to call us back.-

## 2018-04-17 ENCOUNTER — Other Ambulatory Visit: Payer: Self-pay | Admitting: Family Medicine

## 2018-04-17 DIAGNOSIS — R232 Flushing: Secondary | ICD-10-CM

## 2018-06-01 IMAGING — DX DG FOOT COMPLETE 3+V*L*
3 series · 3 of 3 positions shown · non-contrast
Comparison: 02/28/2016 .

CLINICAL DATA: Left foot pain.  No injury.

EXAM:
LEFT FOOT - COMPLETE 3+ VIEW

[foot ap]
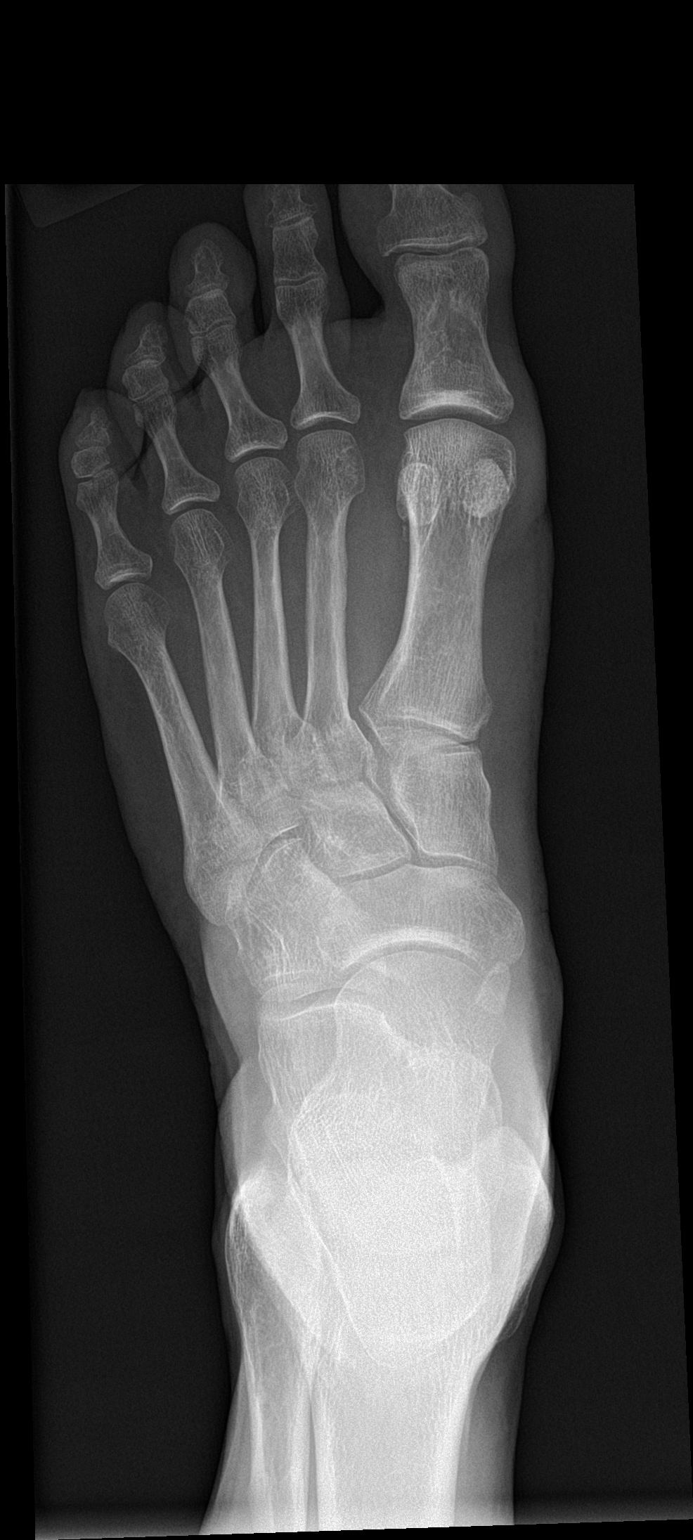

[foot obl]
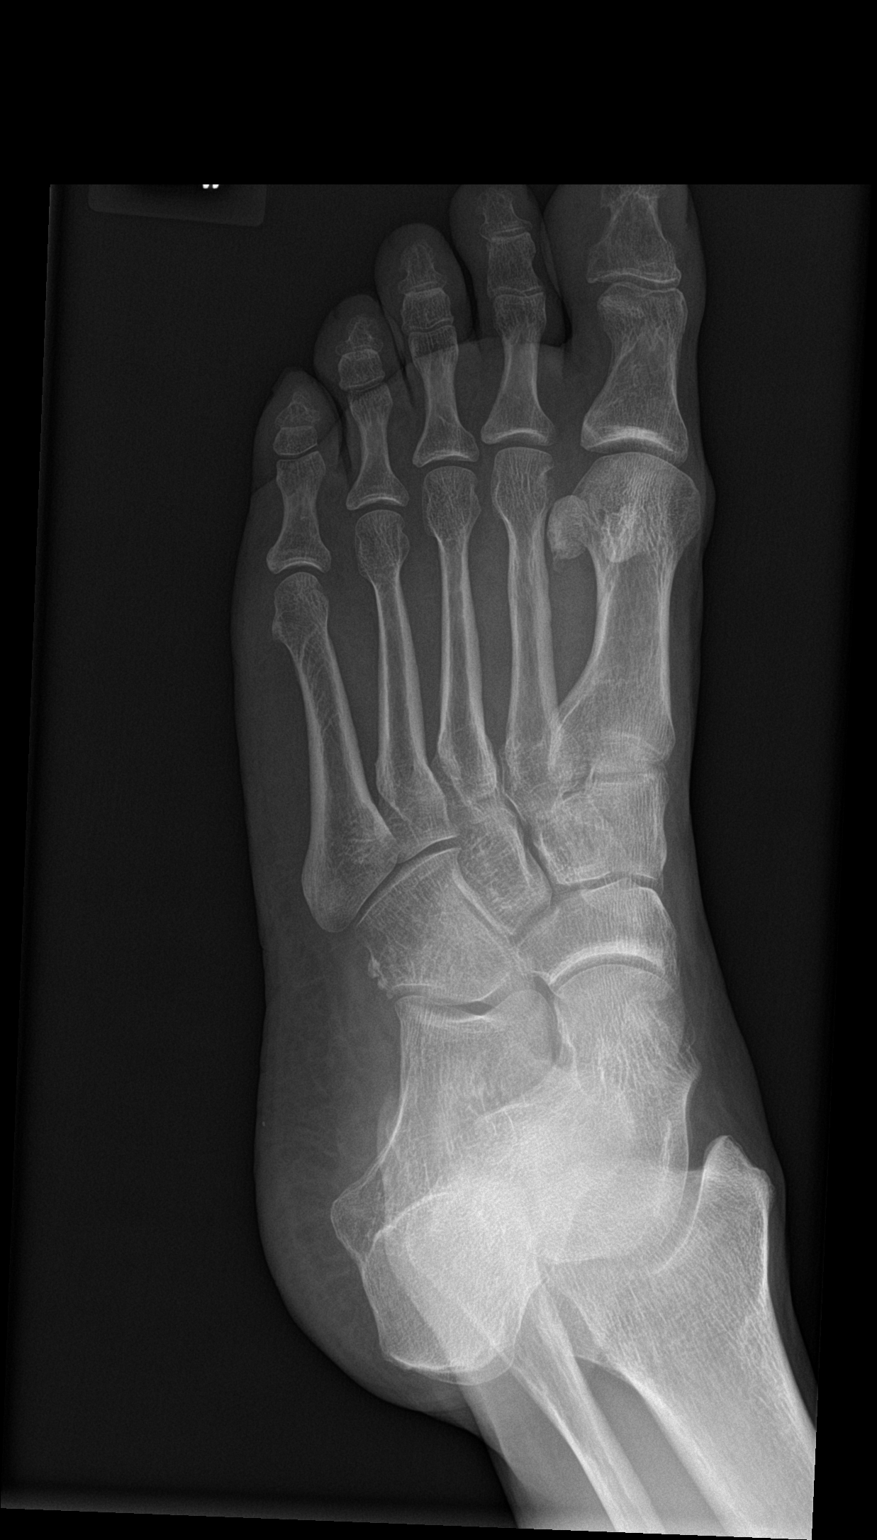

[foot lat]
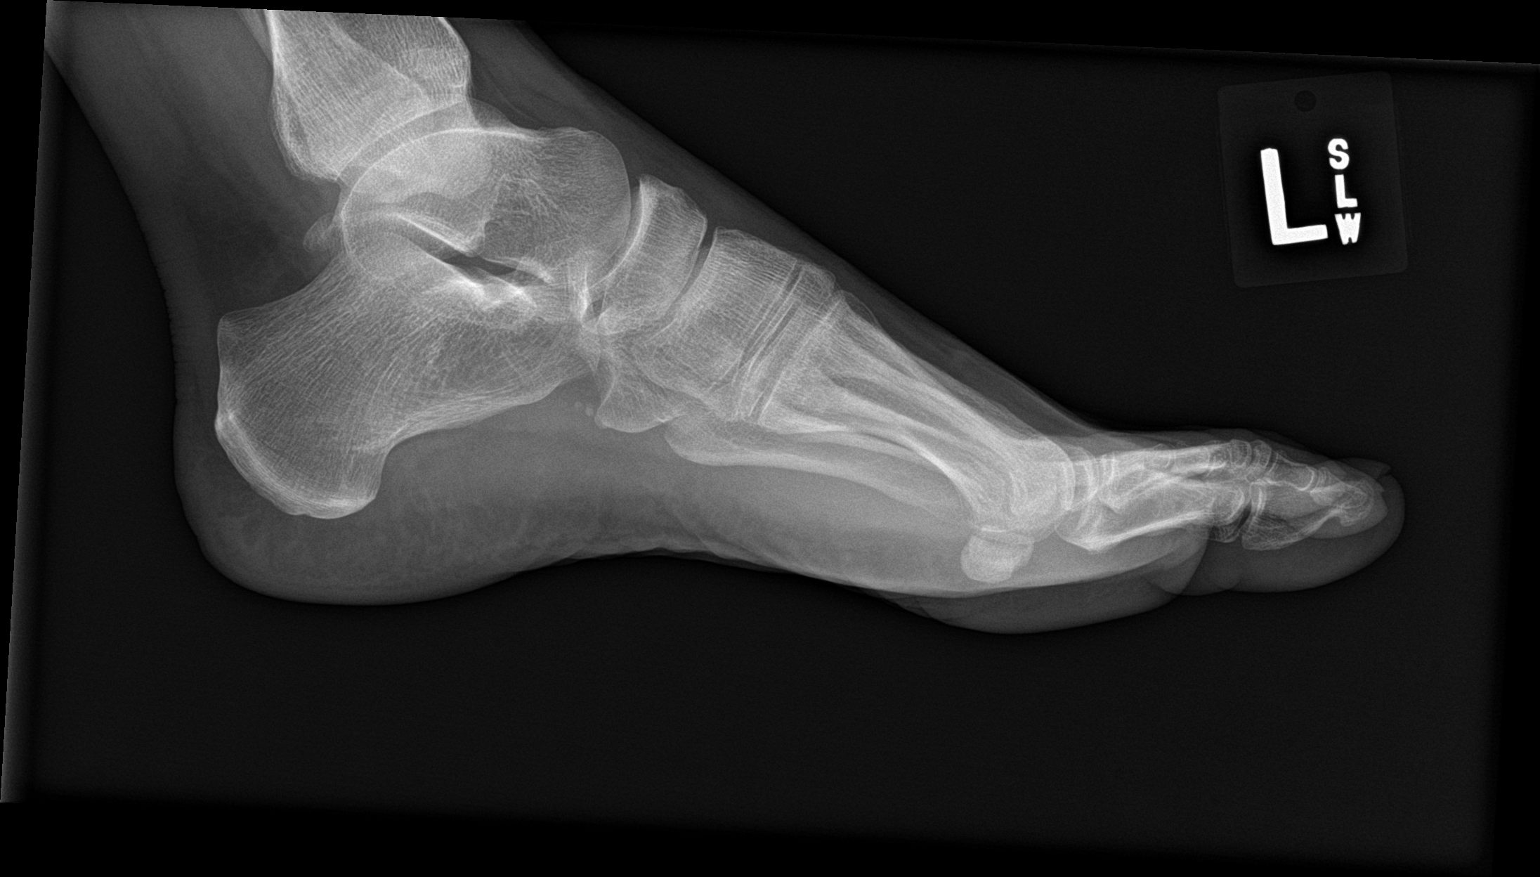

[3 of 3 positions shown; findings below may reference images not displayed]

FINDINGS: No acute bony or joint abnormality identified. No evidence of
fracture dislocation.
IMPRESSION: No acute abnormality .

## 2018-06-07 ENCOUNTER — Ambulatory Visit (INDEPENDENT_AMBULATORY_CARE_PROVIDER_SITE_OTHER): Payer: BC Managed Care – PPO | Admitting: Family Medicine

## 2018-06-07 ENCOUNTER — Encounter: Payer: Self-pay | Admitting: Family Medicine

## 2018-06-07 ENCOUNTER — Other Ambulatory Visit: Payer: Self-pay | Admitting: Family Medicine

## 2018-06-07 ENCOUNTER — Encounter: Payer: Self-pay | Admitting: *Deleted

## 2018-06-07 VITALS — BP 125/66 | HR 104 | Ht 61.0 in | Wt 141.0 lb

## 2018-06-07 DIAGNOSIS — J40 Bronchitis, not specified as acute or chronic: Secondary | ICD-10-CM | POA: Diagnosis not present

## 2018-06-07 DIAGNOSIS — R232 Flushing: Secondary | ICD-10-CM

## 2018-06-07 DIAGNOSIS — J329 Chronic sinusitis, unspecified: Secondary | ICD-10-CM

## 2018-06-07 NOTE — Patient Instructions (Signed)

## 2018-06-07 NOTE — Progress Notes (Signed)
   Subjective:    Patient ID: Kelli Frank, female    DOB: 12/04/1960, 57 y.o.   MRN: [id number]  HPI 57 year old female comes in today planing of not feeling well for about a week.  She actually recently got back from traveling to [place].  About 7 days ago she started having she sinus congestion to just much as okay sinus congestion and chest congestion.  She is been seeing some yellow sputum.  She denies any fevers or chills.  She is been using Mucinex, airborne and Robitussin.  She says at this point her chest and sore from coughing as well as her lower jaw.  She has not had any significant headaches but has had some pressure over the maxillary sinuses.  No diarrhea.   Review of Systems     Objective:   Physical Exam  Constitutional: She is oriented to person, place, and time. She appears well-developed and well-nourished.  HENT:  Head: Normocephalic and atraumatic.  Right Ear: External ear normal.  Left Ear: External ear normal.  Nose: Nose normal.  Mouth/Throat: Oropharynx is clear and moist.  TMs and canals are clear.   Eyes: Pupils are equal, round, and reactive to light. Conjunctivae and EOM are normal.  Neck: Neck supple. No thyromegaly present.  Cardiovascular: Normal rate, regular rhythm and normal heart sounds.  Pulmonary/Chest: Effort normal. She has no wheezes.  diffuse rhonchi on exam.  Lymphadenopathy:    She has no cervical adenopathy.  Neurological: She is alert and oriented to person, place, and time.  Skin: Skin is warm and dry.  Psychiatric: She has a normal mood and affect.       Assessment & Plan:  Acute sinobronchitis -he has had sinusitis and bronchitis symptoms for 1 week and is not improving or getting any better.  She has diffuse rhonchi on exam.  We will go ahead and treat with oral doxycycline.  Call if not improving over the next week.  Recommend continue with symptomatic care.

## 2018-06-08 ENCOUNTER — Telehealth: Payer: Self-pay

## 2018-06-08 MED ORDER — DOXYCYCLINE HYCLATE 100 MG PO TABS: 100.0000 mg | ORAL_TABLET | Freq: Two times a day (BID) | ORAL | 0 refills | Status: DC

## 2018-06-08 NOTE — Telephone Encounter (Signed)
Pt was expecting Doxycycline to be called in after appt yesterday and this was not received at pharmacy.   [facility] [place]?  Thanks

## 2018-06-08 NOTE — Telephone Encounter (Signed)
Patient advised of new Rx.

## 2018-06-08 NOTE — Telephone Encounter (Signed)
New Rx sent. Please tell her Im sorry about that

## 2018-06-08 NOTE — Telephone Encounter (Signed)
Pt left VM stating she is still taking BOTH doses.

## 2018-06-08 NOTE — Telephone Encounter (Signed)
lvm asking pt to rtn call about which mg of effexor she is taking 37.5 or 75.Laureen Ochs.Temari Schooler, Viann Shoveonya Lynetta, CMA

## 2018-07-08 ENCOUNTER — Ambulatory Visit: Payer: BC Managed Care – PPO | Admitting: Family Medicine

## 2018-09-30 ENCOUNTER — Telehealth: Payer: Self-pay | Admitting: Family Medicine

## 2018-09-30 DIAGNOSIS — E782 Mixed hyperlipidemia: Secondary | ICD-10-CM

## 2018-09-30 DIAGNOSIS — R7301 Impaired fasting glucose: Secondary | ICD-10-CM

## 2018-09-30 NOTE — Telephone Encounter (Signed)
lvm asking pt to rtn call with regards to colonoscopy and flu shot.Laureen Ochs, Viann Shove, CMA

## 2018-09-30 NOTE — Telephone Encounter (Signed)
Please call patient and let her know that I have that she is due for colonoscopy.  I have that [facility] and that she was due for repeat in 3 years.  If she has already been then let us just call for records.  Also please ask her if she has had a flu shot this year and or if she would like 1.

## 2018-10-05 ENCOUNTER — Other Ambulatory Visit: Payer: Self-pay | Admitting: *Deleted

## 2018-10-05 DIAGNOSIS — R7301 Impaired fasting glucose: Secondary | ICD-10-CM

## 2018-10-05 DIAGNOSIS — E782 Mixed hyperlipidemia: Secondary | ICD-10-CM

## 2018-10-05 NOTE — Telephone Encounter (Signed)
Called pt and she stated that she has not had the colonoscopy done due to her finances, and she does not take the flu shot at all. Will up date her chart.Laureen Ochs.Carletha Dawn, Viann Shoveonya Lynetta

## 2018-10-28 LAB — COMPLETE METABOLIC PANEL WITH GFR
AG Ratio: 1.6 (calc) (ref 1.0–2.5)
ALBUMIN MSPROF: 4.1 g/dL (ref 3.6–5.1)
ALT: 13 U/L (ref 6–29)
AST: 13 U/L (ref 10–35)
Alkaline phosphatase (APISO): 129 U/L (ref 37–153)
BUN: 9 mg/dL (ref 7–25)
CO2: 25 mmol/L (ref 20–32)
Calcium: 9.6 mg/dL (ref 8.6–10.4)
Chloride: 106 mmol/L (ref 98–110)
Creat: 0.67 mg/dL (ref 0.50–1.05)
GFR, EST AFRICAN AMERICAN: 114 mL/min/{1.73_m2} (ref >=60)
GFR, EST NON AFRICAN AMERICAN: 98 mL/min/{1.73_m2} (ref >=60)
Globulin: 2.6 g/dL (calc) (ref 1.9–3.7)
Glucose, Bld: 99 mg/dL (ref 65–99)
Potassium: 3.9 mmol/L (ref 3.5–5.3)
Sodium: 142 mmol/L (ref 135–146)
Total Bilirubin: 0.5 mg/dL (ref 0.2–1.2)
Total Protein: 6.7 g/dL (ref 6.1–8.1)

## 2018-10-28 LAB — LIPID PANEL
Cholesterol: 336 mg/dL — ABNORMAL HIGH (ref <200)
HDL: 44 mg/dL — ABNORMAL LOW (ref >=50)
LDL Cholesterol (Calc): 245 mg/dL (calc) — ABNORMAL HIGH
Non-HDL Cholesterol (Calc): 292 mg/dL (calc) — ABNORMAL HIGH (ref <130)
Total CHOL/HDL Ratio: 7.6 (calc) — ABNORMAL HIGH (ref <5.0)
Triglycerides: 261 mg/dL — ABNORMAL HIGH (ref <150)

## 2018-11-01 ENCOUNTER — Other Ambulatory Visit: Payer: Self-pay | Admitting: *Deleted

## 2018-11-01 MED ORDER — ATORVASTATIN CALCIUM 20 MG PO TABS: 20.0000 mg | ORAL_TABLET | Freq: Every day | ORAL | 3 refills | Status: DC

## 2018-12-12 ENCOUNTER — Other Ambulatory Visit: Payer: Self-pay | Admitting: Family Medicine

## 2018-12-12 DIAGNOSIS — R232 Flushing: Secondary | ICD-10-CM

## 2019-02-02 ENCOUNTER — Encounter: Payer: Self-pay | Admitting: Family Medicine

## 2019-02-02 ENCOUNTER — Other Ambulatory Visit: Payer: Self-pay | Admitting: Family Medicine

## 2019-02-02 ENCOUNTER — Telehealth: Payer: Self-pay | Admitting: Family Medicine

## 2019-02-02 ENCOUNTER — Telehealth (INDEPENDENT_AMBULATORY_CARE_PROVIDER_SITE_OTHER): Payer: BC Managed Care – PPO | Admitting: Family Medicine

## 2019-02-02 ENCOUNTER — Telehealth: Payer: Self-pay

## 2019-02-02 VITALS — Ht 61.0 in | Wt 133.0 lb

## 2019-02-02 DIAGNOSIS — L237 Allergic contact dermatitis due to plants, except food: Secondary | ICD-10-CM | POA: Diagnosis not present

## 2019-02-02 MED ORDER — TRIAMCINOLONE ACETONIDE 0.5 % EX CREA: 1.0000 "application " | TOPICAL_CREAM | Freq: Two times a day (BID) | CUTANEOUS | 3 refills | Status: DC

## 2019-02-02 MED ORDER — PREDNISONE 5 MG (48) PO TBPK: ORAL_TABLET | ORAL | 0 refills | Status: DC

## 2019-02-02 NOTE — Progress Notes (Signed)
Virtual Visit  via Video Note  I connected with      Kelli Frank by a video enabled telemedicine application and verified that I am speaking with the correct person using two identifiers.   I discussed the limitations of evaluation and management by telemedicine and the availability of in person appointments. The patient expressed understanding and agreed to proceed.  History of Present Illness: Kelli Frank is a 58 y.o. female who would like to discuss poison ivy rash.  On Sunday, May 10 Kelli Frank was doing some gardening.  She realized that she was exposed to poison ivy and went in immediately and washed her arms off.  Unfortunately starting on Monday the 11th she developed an itchy rash on her arm.  Over the next several days she is had with spreading rash now including her face.  She notes it is very itchy and consistent with previous episodes of poison ivy.  She is tried some hydrocortisone cream which did not help much.  She is been using calamine which helps temporarily.  She denies any fevers or chills nausea vomiting or diarrhea.  She feels pretty well otherwise.       Observations/Objective: Ht 5\' 1"  (1.549 m)   Wt 133 lb (60.3 kg)   LMP 06/01/2012   BMI 25.13 kg/m  Wt Readings from Last 5 Encounters:  02/02/19 133 lb (60.3 kg)  06/07/18 141 lb (64 kg)  03/04/18 147 lb (66.7 kg)  11/04/17 145 lb (65.8 kg)  04/19/17 144 lb 9.6 oz (65.6 kg)   Exam: Appearance nontoxic no acute distress Normal Speech.  Skin: Maculopapular erythematous rash on extremities and face.  Patient supplied picture below:           Assessment and Plan: 58 y.o. female with poison ivy dermatitis.  Plan to treat with triamcinolone cream and calamine.  I have prescribed backup tapering prednisone course to use if not improving.  Patient will hold off on oral prednisone for now.  Recheck if not improving in near future.  PDMP not reviewed this encounter. No orders of the defined types were  placed in this encounter.  Meds ordered this encounter  Medications  . triamcinolone cream (KENALOG) 0.5 %    Sig: Apply 1 application topically 2 (two) times daily. To affected areas hot spots.    Dispense:  454 g    Refill:  3  . predniSONE (STERAPRED UNI-PAK 48 TAB) 5 MG (48) TBPK tablet    Sig: 12 day dosepack po    Dispense:  48 tablet    Refill:  0    Follow Up Instructions:    I discussed the assessment and treatment plan with the patient. The patient was provided an opportunity to ask questions and all were answered. The patient agreed with the plan and demonstrated an understanding of the instructions.   The patient was advised to call back or seek an in-person evaluation if the symptoms worsen or if the condition fails to improve as anticipated.  Time: 15 minutes of intraservice time, with >22 minutes of total time during today's visit.      Historical information moved to improve visibility of documentation.  Past Medical History:  Diagnosis Date  . Ovarian cyst, left   . Smoker   . VIN III (vulvar intraepithelial neoplasia III)   . 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 TONSILLECTOMY  1970  . TUBAL LIGATION  2009  bilateral  . VULVAR LESION REMOVAL  06/23/2011   Social History   Tobacco Use  . Smoking status: Former Smoker    Types: Cigarettes  . Smokeless tobacco: Never Used  . Tobacco comment: hx of 25 packyears, now smoking 5-6 cig/day  Substance Use Topics  . Alcohol use: Yes    Comment: occasional   family history includes Cancer in her unknown relative; Heart attack in her father; Hyperlipidemia in her brother and father; Hypertension in her father; Thyroid disease in her brother.  Medications: Current Outpatient Medications  Medication Sig Dispense Refill  . venlafaxine XR (EFFEXOR-XR) 37.5 MG 24 hr capsule TAKE 1 CAPSULE BY MOUTH EVERY DAY 90 capsule 1  . venlafaxine  XR (EFFEXOR-XR) 75 MG 24 hr capsule TAKE 1 CAPSULE(75 MG) BY MOUTH DAILY WITH BREAKFAST 90 capsule 1  . AMBULATORY NON FORMULARY MEDICATION Medication Name: Juice plus    . predniSONE (STERAPRED UNI-PAK 48 TAB) 5 MG (48) TBPK tablet 12 day dosepack po 48 tablet 0  . triamcinolone cream (KENALOG) 0.5 % Apply 1 application topically 2 (two) times daily. To affected areas hot spots. 454 g 3   No current facility-administered medications for this visit.    Allergies  Allergen Reactions  . Penicillins   . Tetanus-Diphtheria Toxoids Td

## 2019-02-02 NOTE — Telephone Encounter (Signed)
Patient got into poison ivy and has it on both arms, blister in her hair line and now a strip over her left eye. Casn something be called in for this. She has been treating it with Calamine lotion. Started yesterday and spreading

## 2019-02-02 NOTE — Telephone Encounter (Signed)
Please schedule for virtual visit this afternoon. 

## 2019-02-02 NOTE — Telephone Encounter (Signed)
Left pt msg to call and scheduled MyChart appt

## 2019-02-02 NOTE — Telephone Encounter (Signed)
Received call from Ankit with [facility] stating that ordered Triamcinolone does not come in 1 pound jar as written, only in 15 g tubes.   Per Dr Denyse Amass, OK to change to Triamcinolone 0.1% so that she can get 1 pound jar.   Verbal given to [facility].

## 2019-04-11 ENCOUNTER — Encounter: Payer: Self-pay | Admitting: Family Medicine

## 2019-04-11 ENCOUNTER — Telehealth (INDEPENDENT_AMBULATORY_CARE_PROVIDER_SITE_OTHER): Payer: BC Managed Care – PPO | Admitting: Family Medicine

## 2019-04-11 VITALS — Ht 61.0 in | Wt 135.0 lb

## 2019-04-11 DIAGNOSIS — S2242XA Multiple fractures of ribs, left side, initial encounter for closed fracture: Secondary | ICD-10-CM | POA: Diagnosis not present

## 2019-04-11 NOTE — Progress Notes (Signed)
Virtual Visit via Video Note  I connected with Kelli Frank on 04/11/19 at  3:00 PM EDT by a video enabled telemedicine application and verified that I am speaking with the correct person using two identifiers.   I discussed the limitations of evaluation and management by telemedicine and the availability of in person appointments. The patient expressed understanding and agreed to proceed.  Pt was at home and I was in my office for the virtual visit.     Subjective:    CC: Follow-up rib pain.  HPI: 58 year old female went to the [facility] urgent care on July 5.  She had evidently fallen in the bathroom and her chest wall.  She felt like it might be related to her blood sugar.  She got lightheaded and then fell and landed on her left chest.  She was having pain with motion and deep breathing.  X-ray report indicates a nondisplaced sixth and seventh rib fracture.  She was initially given driving restrictions for 2 weeks via the urgent care.  Still sore, her ROM is limited using ice and pain med.  She is just been trying to avoid bending and lifting and just try to modify her activities a little bit.  She has been written out of work and was supposed to RTW yesterday. She said that her employer will not allow her to come back until this is done. She is asking that this be filled out so that she can go back to work. Her first day out was 03/27/2019.  Past medical history, Surgical history, Family history not pertinant except as noted below, Social history, Allergies, and medications have been entered into the medical record, reviewed, and corrections made.   Review of Systems: No fevers, chills, night sweats, weight loss, chest pain, or shortness of breath.   Objective:    General: Speaking clearly in complete sentences without any shortness of breath.  Alert and oriented x3.  Normal judgment. No apparent acute distress.    Impression and Recommendations:   Left sided rib fracture-she is  doing better and is safe to return to work.  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  She is okay to drive again.  She will come by and drop off the forms and will get those completed ASAP.  Also offered to provide her a note to return to work if they would be willing to take it.      I discussed the assessment and treatment plan with the patient. The patient was provided an opportunity to ask questions and all were answered. The patient agreed with the plan and demonstrated an understanding of the instructions.   The patient was advised to call back or seek an in-person evaluation if the symptoms worsen or if the condition fails to improve as anticipated.   Beatrice Lecher, MD

## 2019-04-11 NOTE — Progress Notes (Signed)
Pt reports after going to bed she felt nauseated and   Still sore, her ROM is limited using ice and pain med for   She has been written out of work and was supposed to RTW yesterday. She said that her employer will not allow her to come back until this is done. She is asking that this be filled out so that she can go back to work. Her first day out was 03/27/2019.Kelli Frank, [place]

## 2019-04-12 ENCOUNTER — Telehealth: Payer: Self-pay | Admitting: *Deleted

## 2019-04-12 NOTE — Telephone Encounter (Signed)
Called pt and informed her that her forms have been completed. She asked that we send them to her work email:  [email or web address]. gave to Dom B. To email and scan into her chart for completion.Elouise Munroe, [place]

## 2019-04-14 NOTE — Telephone Encounter (Signed)
Kelli Frank,   Patient called and stated she is not able to print the forms which were emailed due to some pages being cut off. Patient requesting that paperwork be faxed to HR dept. with attention to Arturo Morton at [phone]

## 2019-04-14 NOTE — Telephone Encounter (Signed)
Form faxed to Arturo Morton [phone] as per pt's request. Confirmation received .Elouise Munroe, [place]

## 2019-05-31 ENCOUNTER — Other Ambulatory Visit: Payer: Self-pay

## 2019-05-31 ENCOUNTER — Other Ambulatory Visit: Payer: Self-pay | Admitting: Family Medicine

## 2019-05-31 DIAGNOSIS — R232 Flushing: Secondary | ICD-10-CM

## 2019-05-31 MED ORDER — VENLAFAXINE HCL ER 75 MG PO CP24: 75.0000 mg | ORAL_CAPSULE | Freq: Every day | ORAL | 0 refills | Status: DC

## 2019-05-31 NOTE — Telephone Encounter (Signed)
Ok will refill

## 2019-05-31 NOTE — Telephone Encounter (Signed)
Needs appt for this. Will give another refill until can get in here.

## 2019-05-31 NOTE — Telephone Encounter (Signed)
Hubert called for a refill on the Venlafaxine 75 mg. She is going out of town. I advised she needs a follow up appointment. Transferred to the front to schedule. She states she takes the 75 mg and 37.5 mg daily.

## 2019-06-01 NOTE — Telephone Encounter (Signed)
Patient advised.

## 2019-06-14 ENCOUNTER — Other Ambulatory Visit: Payer: Self-pay

## 2019-06-14 ENCOUNTER — Encounter: Payer: Self-pay | Admitting: Family Medicine

## 2019-06-14 ENCOUNTER — Ambulatory Visit: Payer: BC Managed Care – PPO | Admitting: Family Medicine

## 2019-06-14 DIAGNOSIS — E782 Mixed hyperlipidemia: Secondary | ICD-10-CM

## 2019-06-14 DIAGNOSIS — R232 Flushing: Secondary | ICD-10-CM | POA: Diagnosis not present

## 2019-06-14 DIAGNOSIS — Z720 Tobacco use: Secondary | ICD-10-CM | POA: Diagnosis not present

## 2019-06-14 DIAGNOSIS — F17200 Nicotine dependence, unspecified, uncomplicated: Secondary | ICD-10-CM

## 2019-06-14 DIAGNOSIS — M720 Palmar fascial fibromatosis [Dupuytren]: Secondary | ICD-10-CM | POA: Insufficient documentation

## 2019-06-14 DIAGNOSIS — R7301 Impaired fasting glucose: Secondary | ICD-10-CM | POA: Diagnosis not present

## 2019-06-14 DIAGNOSIS — Z1231 Encounter for screening mammogram for malignant neoplasm of breast: Secondary | ICD-10-CM

## 2019-06-14 DIAGNOSIS — T887XXA Unspecified adverse effect of drug or medicament, initial encounter: Secondary | ICD-10-CM

## 2019-06-14 LAB — POCT GLYCOSYLATED HEMOGLOBIN (HGB A1C): Hemoglobin A1C: 6 % — AB (ref 4.0–5.6)

## 2019-06-14 MED ORDER — VENLAFAXINE HCL ER 37.5 MG PO CP24: ORAL_CAPSULE | ORAL | 1 refills | Status: DC

## 2019-06-14 MED ORDER — CHANTIX STARTING MONTH PAK 0.5 MG X 11 & 1 MG X 42 PO TABS: ORAL_TABLET | ORAL | 0 refills | Status: DC

## 2019-06-14 MED ORDER — VENLAFAXINE HCL ER 75 MG PO CP24: 75.0000 mg | ORAL_CAPSULE | Freq: Every day | ORAL | 1 refills | Status: DC

## 2019-06-14 MED ORDER — VARENICLINE TARTRATE 1 MG PO TABS: 1.0000 mg | ORAL_TABLET | Freq: Two times a day (BID) | ORAL | 1 refills | Status: DC

## 2019-06-14 NOTE — Assessment & Plan Note (Signed)
Well controlled. Continue current regimen. Follow up in  6 mo  

## 2019-06-14 NOTE — Assessment & Plan Note (Signed)
We will send her a prescription for Chantix she did well in the past hopefully she will be successful this time.

## 2019-06-14 NOTE — Assessment & Plan Note (Signed)
Plan will be to recheck fasting lipids and if still elevated consider a low-dose statin.  Discussed can even take every other day if it is well-tolerated.

## 2019-06-14 NOTE — Progress Notes (Signed)
Established Patient Office Visit  Subjective:  Patient ID: Kelli Frank, female    DOB: [birth year]  Age: 58 y.o. MRN: [id number]  CC:  Chief Complaint  Patient presents with  . ifg  . Nicotine Dependence    would like to restart chantix    HPI Kelli Frank presents for   Impaired fasting glucose-no increased thirst or urination. No symptoms consistent with hypoglycemia.  Tob abuse - she would like to restart Chantix.  She took it before and did well with it.  She says she is not smoking as heavily as she was but did start back.  She has gained a little bit of weight.  She says that she she fractured her ribs she just was not able to exercise but she says she feels like she is back on track and is starting to be more active again and try to get the weight back off.  Follow-up hyperlipidemia -last lipid panel was in February.  LDL was 245.  Has suggested that she consider starting a statin.  Prescription was sent for atorvastatin 20 mg.  But she is not actively taking the medication. She says she had a statin for a month.  She says the first week she felt nauseated the second week she felt fine the third week she started to feel a little achy in her joints and by the fourth week it was really hard for her to get out of bed she felt so stiff and sore.  So she went ahead and stop the statin. she says she was on the keto diet and eating bacon every day when she had her cholesterol checked and thinks that is what may have caused a big jump in her levels.  She wanted take a look at the nodules on her palm in her left hand.  We had looked at them previously she feels like it just over time it is gotten worse she is getting limited extension of her fingers now.  She says her father had similar symptoms.  Past Medical History:  Diagnosis Date  . Ovarian cyst, left   . Smoker   . VIN III (vulvar intraepithelial neoplasia III)   . 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 TONSILLECTOMY  1970  . TUBAL LIGATION  2009   bilateral  . VULVAR LESION REMOVAL  06/23/2011    Family History  Problem Relation Age of Onset  . Heart attack Father   . Hypertension Father   . Hyperlipidemia Father   . Hyperlipidemia Brother   . Thyroid disease Brother   . Cancer Unknown        grandfather/colorectal    Social History   Socioeconomic History  . Marital status: Single    Spouse name: Not on file  . Number of children: Not on file  . Years of education: Not on file  . Highest education level: Not on file  Occupational History  . Not on file  Social Needs  . Financial resource strain: Not on file  . Food insecurity    Worry: Not on file    Inability: Not on file  . Transportation needs    Medical: Not on file    Non-medical: Not on file  Tobacco Use  . Smoking status: Former Smoker    Types: Cigarettes  . Smokeless tobacco: Never Used  . Tobacco comment: hx of 25 packyears, now smoking 5-6 cig/day  Substance and Sexual Activity  . Alcohol use: Yes    Comment: occasional  . Drug use: No  . Sexual activity: Not Currently    Partners: Male  Lifestyle  . Physical activity    Days per week: Not on file    Minutes per session: Not on file  . Stress: Not on file  Relationships  . Social Musician on phone: Not on file    Gets together: Not on file    Attends religious service: Not on file    Active member of club or organization: Not on file    Attends meetings of clubs or organizations: Not on file    Relationship status: Not on file  . Intimate partner violence    Fear of current or ex partner: Not on file    Emotionally abused: Not on file    Physically abused: Not on file    Forced sexual activity: Not on file  Other Topics Concern  . Not on file  Social History Narrative  . Not on file    Outpatient Medications Prior to Visit  Medication Sig Dispense Refill  . AMBULATORY NON  FORMULARY MEDICATION Medication Name: Juice plus    . venlafaxine XR (EFFEXOR-XR) 37.5 MG 24 hr capsule TAKE 1 CAPSULE BY MOUTH ONCE DAILY. 90 capsule 1  . venlafaxine XR (EFFEXOR-XR) 75 MG 24 hr capsule Take 1 capsule (75 mg total) by mouth daily with breakfast. 30 capsule 0   No facility-administered medications prior to visit.     Allergies  Allergen Reactions  . Penicillins   . Tetanus-Diphtheria Toxoids Td     ROS Review of Systems    Objective:    Physical Exam  Constitutional: She is oriented to person, place, and time. She appears well-developed and well-nourished.  HENT:  Head: Normocephalic and atraumatic.  Cardiovascular: Normal rate, regular rhythm and normal heart sounds.  Pulmonary/Chest: Effort normal and breath sounds normal.  Musculoskeletal:     Comments: Left hand with deep turns contractures.  She does have pretty limited extension of her fingers.  Neurological: She is alert and oriented to person, place, and time.  Skin: Skin is warm and dry.  Psychiatric: She has a normal mood and affect. Her behavior is normal.    LMP 06/01/2012  Wt Readings from Last 3 Encounters:  04/11/19 135 lb (61.2 kg)  02/02/19 133 lb (60.3 kg)  06/07/18 141 lb (64 kg)     Health Maintenance Due  Topic Date Due  . MAMMOGRAM  04/10/2019  . PAP SMEAR-Modifier  05/22/2019    There are no preventive care reminders to display for this patient.  Lab Results  Component Value Date   TSH 1.374 05/02/2008   Lab Results  Component Value Date   WBC 11.6 (H) 05/21/2014   HGB 13.7 05/21/2014   HCT 41.3 05/21/2014   MCV 91.8 05/21/2014   PLT 343 05/21/2014   Lab Results  Component Value Date   NA 142 10/28/2018   K 3.9 10/28/2018   CO2 25 10/28/2018   GLUCOSE 99 10/28/2018   BUN 9 10/28/2018   CREATININE 0.67 10/28/2018   BILITOT 0.5 10/28/2018   ALKPHOS 114 04/09/2017   AST 13 10/28/2018   ALT 13 10/28/2018   PROT 6.7 10/28/2018   ALBUMIN 4.2 04/09/2017    CALCIUM 9.6 10/28/2018   Lab Results  Component Value Date   CHOL 336 (H) 10/28/2018   Lab Results  Component Value Date  HDL 44 (L) 10/28/2018   Lab Results  Component Value Date   LDLCALC 245 (H) 10/28/2018   Lab Results  Component Value Date   TRIG 261 (H) 10/28/2018   Lab Results  Component Value Date   CHOLHDL 7.6 (H) 10/28/2018   Lab Results  Component Value Date   HGBA1C 6.0 (A) 06/14/2019      Assessment & Plan:   Problem List Items Addressed This Visit      Endocrine   IFG (impaired fasting glucose)    Well controlled. Continue current regimen. Follow up in  6 mo        Other   Tobacco dependence    We will send her a prescription for Chantix she did well in the past hopefully she will be successful this time.      Relevant Medications   varenicline (CHANTIX STARTING MONTH PAK) 0.5 MG X 11 & 1 MG X 42 tablet   varenicline (CHANTIX CONTINUING MONTH PAK) 1 MG tablet   Hyperlipidemia    Plan will be to recheck fasting lipids and if still elevated consider a low-dose statin.  Discussed can even take every other day if it is well-tolerated.      Relevant Orders   COMPLETE METABOLIC PANEL WITH GFR   Lipid panel   Dupuytren contracture    Discussed referral to hand orthopedist for definitive treatment.  She started to get significantly limited extension of her fingers particularly on her left hand.      Relevant Orders   Ambulatory referral to Orthopedic Surgery    Other Visit Diagnoses    Impaired fasting glucose    -  Primary   Relevant Orders   POCT glycosylated hemoglobin (Hb A1C) (Completed)   COMPLETE METABOLIC PANEL WITH GFR   Lipid panel   Tobacco abuse       Hot flashes       Relevant Medications   venlafaxine XR (EFFEXOR-XR) 37.5 MG 24 hr capsule   venlafaxine XR (EFFEXOR-XR) 75 MG 24 hr capsule   Screening mammogram, encounter for       Relevant Orders   MM 3D SCREEN BREAST BILATERAL   Medication side effect          Meds  ordered this encounter  Medications  . varenicline (CHANTIX STARTING MONTH PAK) 0.5 MG X 11 & 1 MG X 42 tablet    Sig: Take one 0.5mg  tablet by mouth once daily for 3 days, then increase to one 0.5mg  tablet twice daily for 3 days, then increase to one 1mg  tablet twice daily.    Dispense:  53 tablet    Refill:  0  . varenicline (CHANTIX CONTINUING MONTH PAK) 1 MG tablet    Sig: Take 1 tablet (1 mg total) by mouth 2 (two) times daily.    Dispense:  30 tablet    Refill:  1  . venlafaxine XR (EFFEXOR-XR) 37.5 MG 24 hr capsule    Sig: TAKE 1 CAPSULE BY MOUTH ONCE DAILY.    Dispense:  90 capsule    Refill:  1  . venlafaxine XR (EFFEXOR-XR) 75 MG 24 hr capsule    Sig: Take 1 capsule (75 mg total) by mouth daily with breakfast.    Dispense:  90 capsule    Refill:  1    Follow-up: Return in about 4 weeks (around 07/12/2019) for Wellness Exam and Pap smear. 07/14/2019, MD

## 2019-06-14 NOTE — Assessment & Plan Note (Signed)
Discussed referral to hand orthopedist for definitive treatment.  She started to get significantly limited extension of her fingers particularly on her left hand.

## 2019-06-15 LAB — COMPLETE METABOLIC PANEL WITH GFR
AG Ratio: 1.8 (calc) (ref 1.0–2.5)
ALT: 19 U/L (ref 6–29)
AST: 20 U/L (ref 10–35)
Albumin: 3.9 g/dL (ref 3.6–5.1)
Alkaline phosphatase (APISO): 124 U/L (ref 37–153)
BUN: 17 mg/dL (ref 7–25)
CO2: 25 mmol/L (ref 20–32)
Calcium: 9.2 mg/dL (ref 8.6–10.4)
Chloride: 107 mmol/L (ref 98–110)
Creat: 0.64 mg/dL (ref 0.50–1.05)
GFR, Est African American: 115 mL/min/{1.73_m2} (ref >=60)
GFR, Est Non African American: 99 mL/min/{1.73_m2} (ref >=60)
Globulin: 2.2 g/dL (calc) (ref 1.9–3.7)
Glucose, Bld: 107 mg/dL (ref 65–139)
Potassium: 4.2 mmol/L (ref 3.5–5.3)
Sodium: 141 mmol/L (ref 135–146)
Total Bilirubin: 0.3 mg/dL (ref 0.2–1.2)
Total Protein: 6.1 g/dL (ref 6.1–8.1)

## 2019-06-15 LAB — LIPID PANEL
Cholesterol: 288 mg/dL — ABNORMAL HIGH (ref <200)
HDL: 54 mg/dL (ref >=50)
LDL Cholesterol (Calc): 205 mg/dL (calc) — ABNORMAL HIGH
Non-HDL Cholesterol (Calc): 234 mg/dL (calc) — ABNORMAL HIGH (ref <130)
Total CHOL/HDL Ratio: 5.3 (calc) — ABNORMAL HIGH (ref <5.0)
Triglycerides: 139 mg/dL (ref <150)

## 2019-06-28 ENCOUNTER — Other Ambulatory Visit: Payer: Self-pay | Admitting: Physician Assistant

## 2019-06-28 DIAGNOSIS — R232 Flushing: Secondary | ICD-10-CM

## 2019-07-12 ENCOUNTER — Encounter: Payer: BC Managed Care – PPO | Admitting: Family Medicine

## 2019-07-19 ENCOUNTER — Ambulatory Visit: Payer: BC Managed Care – PPO

## 2019-08-09 ENCOUNTER — Ambulatory Visit: Payer: BC Managed Care – PPO | Admitting: Family Medicine

## 2019-08-09 ENCOUNTER — Ambulatory Visit (INDEPENDENT_AMBULATORY_CARE_PROVIDER_SITE_OTHER): Payer: BC Managed Care – PPO

## 2019-08-09 ENCOUNTER — Encounter: Payer: Self-pay | Admitting: Family Medicine

## 2019-08-09 ENCOUNTER — Other Ambulatory Visit: Payer: Self-pay

## 2019-08-09 ENCOUNTER — Other Ambulatory Visit (HOSPITAL_COMMUNITY)
Admission: RE | Admit: 2019-08-09 | Discharge: 2019-08-09 | Disposition: A | Discharge status: Home / Self Care | Payer: BC Managed Care – PPO | Source: Ambulatory Visit | Attending: Family Medicine | Admitting: Family Medicine

## 2019-08-09 VITALS — BP 130/75 | HR 79 | Ht 61.0 in | Wt 140.0 lb

## 2019-08-09 DIAGNOSIS — Z1231 Encounter for screening mammogram for malignant neoplasm of breast: Secondary | ICD-10-CM

## 2019-08-09 DIAGNOSIS — Z124 Encounter for screening for malignant neoplasm of cervix: Secondary | ICD-10-CM | POA: Insufficient documentation

## 2019-08-09 DIAGNOSIS — Z0000 Encounter for general adult medical examination without abnormal findings: Secondary | ICD-10-CM

## 2019-08-09 DIAGNOSIS — F17200 Nicotine dependence, unspecified, uncomplicated: Secondary | ICD-10-CM | POA: Diagnosis not present

## 2019-08-09 MED ORDER — VARENICLINE TARTRATE 1 MG PO TABS: 1.0000 mg | ORAL_TABLET | Freq: Two times a day (BID) | ORAL | 1 refills | Status: DC

## 2019-08-09 NOTE — Progress Notes (Signed)
Subjective:     Kelli Frank is a 58 y.o. female and is here for a comprehensive physical exam. The patient reports no problems.  She is exercising some. She drinks maybe a total of one bottle of wine per month.    Social History   Socioeconomic History  . Marital status: Single    Spouse name: Not on file  . Number of children: Not on file  . Years of education: Not on file  . Highest education level: Not on file  Occupational History  . Not on file  Social Needs  . Financial resource strain: Not on file  . Food insecurity    Worry: Not on file    Inability: Not on file  . Transportation needs    Medical: Not on file    Non-medical: Not on file  Tobacco Use  . Smoking status: Former Smoker    Types: Cigarettes  . Smokeless tobacco: Never Used  . Tobacco comment: hx of 25 packyears, now smoking 5-6 cig/day  Substance and Sexual Activity  . Alcohol use: Yes    Comment: occasional  . Drug use: No  . Sexual activity: Not Currently    Partners: Male  Lifestyle  . Physical activity    Days per week: Not on file    Minutes per session: Not on file  . Stress: Not on file  Relationships  . Social Herbalist on phone: Not on file    Gets together: Not on file    Attends religious service: Not on file    Active member of club or organization: Not on file    Attends meetings of clubs or organizations: Not on file    Relationship status: Not on file  . Intimate partner violence    Fear of current or ex partner: Not on file    Emotionally abused: Not on file    Physically abused: Not on file    Forced sexual activity: Not on file  Other Topics Concern  . Not on file  Social History Narrative  . Not on file   Health Maintenance  Topic Date Due  . MAMMOGRAM  04/10/2019  . PAP SMEAR-Modifier  05/22/2019  . COLONOSCOPY  10/06/2019 (Originally 06/19/2017)  . Hepatitis C Screening  10/06/2019 (Originally 01-01-1961)  . HIV Screening  10/06/2019 (Originally 02/23/1977)   . INFLUENZA VACCINE  12/20/2019 (Originally 04/22/2019)    The following portions of the patient's history were reviewed and updated as appropriate: allergies, current medications, past family history, past medical history, past social history, past surgical history and problem list.  Review of Systems A comprehensive review of systems was negative.   Objective:    BP (!) 144/72   Pulse 79   Ht 5\' 1"  (1.549 m)   Wt 140 lb (63.5 kg)   LMP 06/01/2012   SpO2 98%   BMI 26.45 kg/m  General appearance: alert, cooperative and appears stated age Head: Normocephalic, without obvious abnormality, atraumatic Eyes: conj clear, EOMI, PEERLA Ears: normal TM's and external ear canals both ears Nose: Nares normal. Septum midline. Mucosa normal. No drainage or sinus tenderness. Throat: lips, mucosa, and tongue normal; teeth and gums normal Neck: no adenopathy, no carotid bruit, no JVD, supple, symmetrical, trachea midline and thyroid not enlarged, symmetric, no tenderness/mass/nodules Back: symmetric, no curvature. ROM normal. No CVA tenderness. Lungs: clear to auscultation bilaterally Breasts: normal appearance, no masses or tenderness Heart: regular rate and rhythm, S1, S2 normal, no murmur, click, rub  or gallop Abdomen: soft, non-tender; bowel sounds normal; no masses,  no organomegaly Pelvic: cervix normal in appearance, external genitalia normal, no adnexal masses or tenderness, no cervical motion tenderness, rectovaginal septum normal, uterus normal size, shape, and consistency and vagina normal without discharge Extremities: extremities normal, atraumatic, no cyanosis or edema Pulses: 2+ and symmetric Skin: Skin color, texture, turgor normal. No rashes or lesions Lymph nodes: Cervical, supraclavicular, and axillary nodes normal. Neurologic: Alert and oriented X 3, normal strength and tone. Normal symmetric reflexes. Normal coordination and gait    Assessment:    Healthy female exam.       Plan:     See After Visit Summary for Counseling Recommendations   Keep up a regular exercise program and make sure you are eating a healthy diet Try to eat 4 servings of dairy a day, or if you are lactose intolerant take a calcium with vitamin D daily.  Your vaccines are up to date.  Handout provided on the shingles vaccine and discussed today.  mammo controlled for later today. We will need to get copy of last colonoscopy entered into chart.  It looks like it was in September 2015.

## 2019-08-09 NOTE — Patient Instructions (Signed)
Health Maintenance, Female Adopting a healthy lifestyle and getting preventive care are important in promoting health and wellness. Ask your health care provider about:  The right schedule for you to have regular tests and exams.  Things you can do on your own to prevent diseases and keep yourself healthy. What should I know about diet, weight, and exercise? Eat a healthy diet   Eat a diet that includes plenty of vegetables, fruits, low-fat dairy products, and lean protein.  Do not eat a lot of foods that are high in solid fats, added sugars, or sodium. Maintain a healthy weight Body mass index (BMI) is used to identify weight problems. It estimates body fat based on height and weight. Your health care provider can help determine your BMI and help you achieve or maintain a healthy weight. Get regular exercise Get regular exercise. This is one of the most important things you can do for your health. Most adults should:  Exercise for at least 150 minutes each week. The exercise should increase your heart rate and make you sweat (moderate-intensity exercise).  Do strengthening exercises at least twice a week. This is in addition to the moderate-intensity exercise.  Spend less time sitting. Even light physical activity can be beneficial. Watch cholesterol and blood lipids Have your blood tested for lipids and cholesterol at 57 years of age, then have this test every 5 years. Have your cholesterol levels checked more often if:  Your lipid or cholesterol levels are high.  You are older than 57 years of age.  You are at high risk for heart disease. What should I know about cancer screening? Depending on your health history and family history, you may need to have cancer screening at various ages. This may include screening for:  Breast cancer.  Cervical cancer.  Colorectal cancer.  Skin cancer.  Lung cancer. What should I know about heart disease, diabetes, and high blood  pressure? Blood pressure and heart disease  High blood pressure causes heart disease and increases the risk of stroke. This is more likely to develop in people who have high blood pressure readings, are of African descent, or are overweight.  Have your blood pressure checked: ? Every 3-5 years if you are 18-39 years of age. ? Every year if you are 40 years old or older. Diabetes Have regular diabetes screenings. This checks your fasting blood sugar level. Have the screening done:  Once every three years after age 40 if you are at a normal weight and have a low risk for diabetes.  More often and at a younger age if you are overweight or have a high risk for diabetes. What should I know about preventing infection? Hepatitis B If you have a higher risk for hepatitis B, you should be screened for this virus. Talk with your health care provider to find out if you are at risk for hepatitis B infection. Hepatitis C Testing is recommended for:  Everyone born from 1945 through 1965.  Anyone with known risk factors for hepatitis C. Sexually transmitted infections (STIs)  Get screened for STIs, including gonorrhea and chlamydia, if: ? You are sexually active and are younger than 57 years of age. ? You are older than 57 years of age and your health care provider tells you that you are at risk for this type of infection. ? Your sexual activity has changed since you were last screened, and you are at increased risk for chlamydia or gonorrhea. Ask your health care provider if   you are at risk.  Ask your health care provider about whether you are at high risk for HIV. Your health care provider may recommend a prescription medicine to help prevent HIV infection. If you choose to take medicine to prevent HIV, you should first get tested for HIV. You should then be tested every 3 months for as long as you are taking the medicine. Pregnancy  If you are about to stop having your period (premenopausal) and  you may become pregnant, seek counseling before you get pregnant.  Take 400 to 800 micrograms (mcg) of folic acid every day if you become pregnant.  Ask for birth control (contraception) if you want to prevent pregnancy. Osteoporosis and menopause Osteoporosis is a disease in which the bones lose minerals and strength with aging. This can result in bone fractures. If you are 65 years old or older, or if you are at risk for osteoporosis and fractures, ask your health care provider if you should:  Be screened for bone loss.  Take a calcium or vitamin D supplement to lower your risk of fractures.  Be given hormone replacement therapy (HRT) to treat symptoms of menopause. Follow these instructions at home: Lifestyle  Do not use any products that contain nicotine or tobacco, such as cigarettes, e-cigarettes, and chewing tobacco. If you need help quitting, ask your health care provider.  Do not use street drugs.  Do not share needles.  Ask your health care provider for help if you need support or information about quitting drugs. Alcohol use  Do not drink alcohol if: ? Your health care provider tells you not to drink. ? You are pregnant, may be pregnant, or are planning to become pregnant.  If you drink alcohol: ? Limit how much you use to 0-1 drink a day. ? Limit intake if you are breastfeeding.  Be aware of how much alcohol is in your drink. In the [place] one drink equals one 12 oz bottle of beer (355 mL), one 5 oz glass of wine (148 mL), or one 1 oz glass of hard liquor (44 mL). General instructions  Schedule regular health, dental, and eye exams.  Stay current with your vaccines.  Tell your health care provider if: ? You often feel depressed. ? You have ever been abused or do not feel safe at home. Summary  Adopting a healthy lifestyle and getting preventive care are important in promoting health and wellness.  Follow your health care provider's instructions about healthy  diet, exercising, and getting tested or screened for diseases.  Follow your health care provider's instructions on monitoring your cholesterol and blood pressure. This information is not intended to replace advice given to you by your health care provider. Make sure you discuss any questions you have with your health care provider. Document Released: 03/23/2011 Document Revised: 08/31/2018 Document Reviewed: 08/31/2018 Elsevier Patient Education  2020 [employer]  

## 2019-08-10 LAB — CYTOLOGY - PAP
Adequacy: ABSENT
Comment: NEGATIVE
Diagnosis: NEGATIVE
High risk HPV: NEGATIVE

## 2019-08-11 NOTE — Progress Notes (Signed)
Call patient: Your Pap smear is normal. Repeat in 5 years.

## 2019-12-06 ENCOUNTER — Other Ambulatory Visit: Payer: Self-pay | Admitting: *Deleted

## 2019-12-06 DIAGNOSIS — R232 Flushing: Secondary | ICD-10-CM

## 2019-12-06 MED ORDER — VENLAFAXINE HCL ER 75 MG PO CP24: ORAL_CAPSULE | ORAL | 1 refills | Status: DC

## 2020-01-07 ENCOUNTER — Encounter: Payer: Self-pay | Admitting: Emergency Medicine

## 2020-01-07 ENCOUNTER — Emergency Department
Admission: EM | Admit: 2020-01-07 | Discharge: 2020-01-07 | Disposition: A | Discharge status: Home / Self Care | Payer: BC Managed Care – PPO | Source: Home / Self Care

## 2020-01-07 ENCOUNTER — Other Ambulatory Visit: Payer: Self-pay

## 2020-01-07 DIAGNOSIS — N3001 Acute cystitis with hematuria: Secondary | ICD-10-CM

## 2020-01-07 DIAGNOSIS — R3915 Urgency of urination: Secondary | ICD-10-CM

## 2020-01-07 LAB — POCT URINALYSIS DIP (MANUAL ENTRY)
Bilirubin, UA: NEGATIVE
Glucose, UA: NEGATIVE mg/dL
Ketones, POC UA: NEGATIVE mg/dL
Nitrite, UA: NEGATIVE
Protein Ur, POC: 100 mg/dL — AB
Spec Grav, UA: 1.025 (ref 1.010–1.025)
Urobilinogen, UA: 0.2 E.U./dL
pH, UA: 5.5 (ref 5.0–8.0)

## 2020-01-07 MED ORDER — SULFAMETHOXAZOLE-TRIMETHOPRIM 800-160 MG PO TABS: 1.0000 | ORAL_TABLET | Freq: Two times a day (BID) | ORAL | 0 refills | Status: AC

## 2020-01-07 NOTE — ED Triage Notes (Signed)
Patient c/o possible UTI, sx's started on Friday, now having hematuria, urgency and frequency.

## 2020-01-07 NOTE — ED Provider Notes (Signed)
[facility]    CSN: [id number] Arrival date & time: 01/07/20  1057      History   Chief Complaint Chief Complaint  Patient presents with  . Urinary Tract Infection    HPI Kelli Frank is a 59 y.o. female.   HPI Kelli Frank is a 59 y.o. female presenting to UC with c/o sudden onset, worsening dysuria that started 2 days ago, has worsened into gross hematuria, urgency and frequency.  Hx of UTI a few years ago. Symptoms feel similar. Denies fever, chills, n/v/d. Pt notes when she gets UTIs, she typically needs to be on a 7 day rather than 3 or 5 day course of antibiotics. She has done with "sulfa drugs" in the past for her UTIs.   Past Medical History:  Diagnosis Date  . Ovarian cyst, left   . Smoker   . VIN III (vulvar intraepithelial neoplasia III)   . 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 Dupuytren contracture 06/14/2019  . Tobacco dependence 09/23/2016  . Left foot pain 03/13/2016  . De Quervain's tenosynovitis, right 04/03/2015  . 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 OVARIAN CYST, LEFT 05/21/2010  . IFG (impaired fasting glucose) 05/30/2008  . Hyperlipidemia 05/30/2008  . FATIGUE 03/30/2008    Past Surgical History:  Procedure Laterality Date  . TONSILLECTOMY  1970  . TUBAL LIGATION  2009   bilateral  . VULVAR LESION REMOVAL  06/23/2011    OB History    Gravida  2   Para      Term      Preterm      AB  2   Living  0     SAB      TAB      Ectopic      Multiple      Live Births               Home Medications    Prior to Admission medications   Medication Sig Start Date End Date Taking? Authorizing Provider  venlafaxine XR (EFFEXOR-XR) 37.5 MG 24 hr capsule TAKE 1 CAPSULE BY MOUTH ONCE DAILY. 06/14/19  Yes Agapito Games, MD  venlafaxine XR (EFFEXOR-XR) 75 MG 24 hr capsule TAKE 1  CAPSULE BY MOUTH EVERY DAY WITH BREAKFAST 12/06/19  Yes Agapito Games, MD  AMBULATORY NON FORMULARY MEDICATION Medication Name: Juice plus    [provider]  sulfamethoxazole-trimethoprim (BACTRIM DS) 800-160 MG tablet Take 1 tablet by mouth 2 (two) times daily for 7 days. 01/07/20 01/14/20  Lurene Shadow, PA-C  varenicline (CHANTIX CONTINUING MONTH PAK) 1 MG tablet Take 1 tablet (1 mg total) by mouth 2 (two) times daily. 08/09/19   Agapito Games, MD    Family History Family History  Problem Relation Age of Onset  . Heart attack Father   . Hypertension Father   . Hyperlipidemia Father   . Hyperlipidemia Brother   . Thyroid disease Brother   . Cancer Other        grandfather/colorectal    Social History Social History   Tobacco Use  . Smoking status: Former Smoker    Types: Cigarettes  . Smokeless tobacco: Never Used  . Tobacco comment: hx of 25 packyears, now smoking 5-6 cig/day  Substance Use Topics  . Alcohol use: Yes    Comment: occasional  . Drug use: No  Allergies   Penicillins, Atorvastatin, and Tetanus-diphtheria toxoids td   Review of Systems Review of Systems  Constitutional: Negative for chills and fever.  Gastrointestinal: Negative for abdominal pain, diarrhea, nausea and vomiting.  Genitourinary: Positive for dysuria, frequency, hematuria and urgency. Negative for flank pain and pelvic pain.  Neurological: Negative for dizziness and headaches.     Physical Exam Triage Vital Signs ED Triage Vitals  Enc Vitals Group     BP 01/07/20 1132 (!) 172/99     Pulse Rate 01/07/20 1132 93     Resp --      Temp 01/07/20 1132 98.3 F (36.8 C)     Temp Source 01/07/20 1132 Oral     SpO2 01/07/20 1132 98 %     Weight --      Height --      Head Circumference --      Peak Flow --      Pain Score 01/07/20 1133 7     Pain Loc --      Pain Edu? --      Excl. in GC? --    No data found.  Updated Vital Signs BP (!) 172/99 (BP  Location: Left Arm)   Pulse 93   Temp 98.3 F (36.8 C) (Oral)   LMP 06/01/2012   SpO2 98%   Visual Acuity Right Eye Distance:   Left Eye Distance:   Bilateral Distance:    Right Eye Near:   Left Eye Near:    Bilateral Near:     Physical Exam Vitals and nursing note reviewed.  Constitutional:      General: She is not in acute distress.    Appearance: Normal appearance. She is well-developed. She is not ill-appearing, toxic-appearing or diaphoretic.  HENT:     Head: Normocephalic and atraumatic.     Mouth/Throat:     Mouth: Mucous membranes are moist.  Cardiovascular:     Rate and Rhythm: Normal rate and regular rhythm.  Pulmonary:     Effort: Pulmonary effort is normal. No respiratory distress.     Breath sounds: Normal breath sounds.  Abdominal:     General: There is no distension.     Palpations: Abdomen is soft.     Tenderness: There is no abdominal tenderness. There is no right CVA tenderness or left CVA tenderness.  Musculoskeletal:        General: Normal range of motion.     Cervical back: Normal range of motion.  Skin:    General: Skin is warm and dry.  Neurological:     Mental Status: She is alert and oriented to person, place, and time.  Psychiatric:        Behavior: Behavior normal.      UC Treatments / Results  Labs (all labs ordered are listed, but only abnormal results are displayed) Labs Reviewed  POCT URINALYSIS DIP (MANUAL ENTRY) - Abnormal; Notable for the following components:      Result Value   Clarity, UA cloudy (*)    Blood, UA large (*)    Protein Ur, POC =100 (*)    Leukocytes, UA Small (1+) (*)    All other components within normal limits  URINE CULTURE    EKG   Radiology No results found.  Procedures Procedures (including critical care time)  Medications Ordered in UC Medications - No data to display  Initial Impression / Assessment and Plan / UC Course  I have reviewed the triage vital signs and the nursing  notes.  Pertinent labs & imaging results that were available during my care of the patient were reviewed by me and considered in my medical decision making (see chart for details).     Hx and UA c/w UTI Per pt, she does best on "sulfa drugs" for her UTIs  Will start pt on 7 days of bactrim while urine culture pending  F/u with PCP later this week if needed AVS provided  Final Clinical Impressions(s) / UC Diagnoses   Final diagnoses:  Urinary urgency  Acute cystitis with hematuria     Discharge Instructions      Please take your antibiotic as prescribed. A urine culture has been sent to check the severity of your urinary infection and to determine if you are on the most appropriate antibiotic. The results should come back within 2-3 days. You will only be notified if a medication change is indicated.  Please follow up with family medicine or urology if not improving within 1 week, sooner if symptoms worsening.      ED Prescriptions    Medication Sig Dispense Auth. Provider   sulfamethoxazole-trimethoprim (BACTRIM DS) 800-160 MG tablet Take 1 tablet by mouth 2 (two) times daily for 7 days. 14 tablet Noe Gens, [place]     PDMP not reviewed this encounter.   Noe Gens, [place] 01/07/20 1504

## 2020-01-07 NOTE — Discharge Instructions (Signed)
  Please take your antibiotic as prescribed. A urine culture has been sent to check the severity of your urinary infection and to determine if you are on the most appropriate antibiotic. The results should come back within 2-3 days. You will only be notified if a medication change is indicated.  Please follow up with family medicine or urology if not improving within 1 week, sooner if symptoms worsening.   

## 2020-01-10 LAB — URINE CULTURE
MICRO NUMBER:: 10378598
SPECIMEN QUALITY:: ADEQUATE

## 2020-01-26 ENCOUNTER — Telehealth: Payer: Self-pay | Admitting: Family Medicine

## 2020-01-26 NOTE — Telephone Encounter (Signed)
LVM asking that she call back w/updated information with regards to her colonoscopy or if we need to place a referral for her to go and have this done.

## 2020-01-26 NOTE — Telephone Encounter (Signed)
Please call patient and let her know that [facility] and they recommended that she have a repeat in 3 years which was 2018 because of some abnormalities found.  Sweats actually now been 6 years since her last scope.  If she has had it done since 2018 please let us know so that we can call and get the most updated information into her chart.  If she has not been I would really strongly encourage her to let us get her scheduled to rule out any significant abnormalities.

## 2020-02-02 NOTE — Telephone Encounter (Signed)
Letter sent to pt

## 2020-02-15 NOTE — Telephone Encounter (Signed)
Kelli Frank called and left a message stating she can not afford to have a colonoscopy at this time.

## 2020-06-21 ENCOUNTER — Other Ambulatory Visit: Payer: Self-pay | Admitting: Family Medicine

## 2020-06-21 DIAGNOSIS — R232 Flushing: Secondary | ICD-10-CM

## 2020-07-23 ENCOUNTER — Other Ambulatory Visit: Payer: Self-pay | Admitting: Family Medicine

## 2020-07-23 DIAGNOSIS — R232 Flushing: Secondary | ICD-10-CM

## 2020-09-02 ENCOUNTER — Encounter: Payer: Self-pay | Admitting: Family Medicine

## 2020-09-02 ENCOUNTER — Ambulatory Visit: Payer: BC Managed Care – PPO | Admitting: Family Medicine

## 2020-09-02 VITALS — BP 136/70 | HR 82 | Ht 61.0 in | Wt 123.0 lb

## 2020-09-02 DIAGNOSIS — E782 Mixed hyperlipidemia: Secondary | ICD-10-CM

## 2020-09-02 DIAGNOSIS — R7301 Impaired fasting glucose: Secondary | ICD-10-CM

## 2020-09-02 DIAGNOSIS — Z113 Encounter for screening for infections with a predominantly sexual mode of transmission: Secondary | ICD-10-CM

## 2020-09-02 DIAGNOSIS — R232 Flushing: Secondary | ICD-10-CM

## 2020-09-02 DIAGNOSIS — L989 Disorder of the skin and subcutaneous tissue, unspecified: Secondary | ICD-10-CM

## 2020-09-02 MED ORDER — VENLAFAXINE HCL ER 37.5 MG PO CP24: 37.5000 mg | ORAL_CAPSULE | Freq: Every day | ORAL | 1 refills | Status: DC

## 2020-09-02 MED ORDER — VENLAFAXINE HCL ER 75 MG PO CP24: 75.0000 mg | ORAL_CAPSULE | Freq: Every day | ORAL | 1 refills | Status: DC

## 2020-09-02 NOTE — Progress Notes (Signed)
She reports that she has stopped sodas,most sugar (uses organic in coffee), and carbs.  She would like for an area on her back R shoulder to be checked. It has been there for 6 mos. She thought it was a pimple and attempted to pop it.

## 2020-09-02 NOTE — Progress Notes (Signed)
Established Patient Office Visit  Subjective:  Patient ID: Kelli Frank, female    DOB: July 14, 1961  Age: 59 y.o. MRN: [id number]  CC:  Chief Complaint  Patient presents with  . Hot Flashes    HPI Kelli Frank presents for   Follow-up hot flashes-she is doing well with her Effexor is happy with her current regimen she would like to continue it.  She reports that she has stopped sodas,most sugar (uses organic in coffee), and carbs.  She would like for an area on her back R shoulder to be checked. It has been there for 6 mos. She thought it was a pimple and attempted to pop it.   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 No symptoms consistent with hypoglycemia.  She also recently moved after break-up with her long-term boyfriend of 16 years.  He was cheating on her.   Past Medical History:  Diagnosis Date  . Ovarian cyst, left   . Smoker   . VIN III (vulvar intraepithelial neoplasia III)   . 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 TONSILLECTOMY  1970  . TUBAL LIGATION  2009   bilateral  . VULVAR LESION REMOVAL  06/23/2011    Family History  Problem Relation Age of Onset  . Heart attack Father   . Hypertension Father   . Hyperlipidemia Father   . Hyperlipidemia Brother   . Thyroid disease Brother   . Cancer Other        grandfather/colorectal    Social History   Socioeconomic History  . Marital status: Single    Spouse name: Not on file  . Number of children: Not on file  . Years of education: Not on file  . Highest education level: Not on file  Occupational History  . Not on file  Tobacco Use  . Smoking status: Former Smoker    Types: Cigarettes  . Smokeless tobacco: Never Used  . Tobacco comment: hx of 25 packyears, now smoking 5-6 cig/day  Substance and Sexual Activity  . Alcohol use: Yes    Comment: occasional  .  Drug use: No  . Sexual activity: Not Currently    Partners: Male  Other Topics Concern  . Not on file  Social History Narrative  . Not on file   Social Determinants of Health   Financial Resource Strain: Not on file  Food Insecurity: Not on file  Transportation Needs: Not on file  Physical Activity: Not on file  Stress: Not on file  Social Connections: Not on file  Intimate Partner Violence: Not on file    Outpatient Medications Prior to Visit  Medication Sig Dispense Refill  . AMBULATORY NON FORMULARY MEDICATION Medication Name: Juice plus    . varenicline (CHANTIX CONTINUING MONTH PAK) 1 MG tablet Take 1 tablet (1 mg total) by mouth 2 (two) times daily. 30 tablet 1  . venlafaxine XR (EFFEXOR-XR) 37.5 MG 24 hr capsule TAKE ONE CAPSULE BY MOUTH DAILY. 30 DAY SUPPLY. 30 capsule 0  . venlafaxine XR (EFFEXOR-XR) 75 MG 24 hr capsule TAKE ONE CAPSULE BY MOUTH EVERY DAY WITH BREAKFAST. 30 DAY SUPPLY. 30 capsule 0   No facility-administered medications prior to visit.    Allergies  Allergen Reactions  . Penicillins Other (See Comments)    swelling  . Atorvastatin Other (See Comments)  . Tetanus-Diphtheria Toxoids Td  ROS Review of Systems    Objective:    Physical Exam Constitutional:      Appearance: She is well-developed and well-nourished.  HENT:     Head: Normocephalic and atraumatic.  Cardiovascular:     Rate and Rhythm: Normal rate and regular rhythm.     Heart sounds: Normal heart sounds.  Pulmonary:     Effort: Pulmonary effort is normal.     Breath sounds: Normal breath sounds.  Skin:    General: Skin is warm and dry.     Comments: And lesion on right upper back is just slightly smaller than a centimeter and papular in the center looks dark or black.  No crusting no open wound or drainage.  Neurological:     Mental Status: She is alert and oriented to person, place, and time.  Psychiatric:        Mood and Affect: Mood and affect normal.         Behavior: Behavior normal.     BP 136/70   Pulse 82   Ht 5\' 1"  (1.549 m)   Wt 123 lb (55.8 kg)   LMP 06/01/2012   SpO2 99%   BMI 23.24 kg/m  Wt Readings from Last 3 Encounters:  09/02/20 123 lb (55.8 kg)  08/09/19 140 lb (63.5 kg)  04/11/19 135 lb (61.2 kg)     Health Maintenance Due  Topic Date Due  . Hepatitis C Screening  Never done  . HIV Screening  Never done    There are no preventive care reminders to display for this patient.  Lab Results  Component Value Date   TSH 1.374 05/02/2008   Lab Results  Component Value Date   WBC 11.6 (H) 05/21/2014   HGB 13.7 05/21/2014   HCT 41.3 05/21/2014   MCV 91.8 05/21/2014   PLT 343 05/21/2014   Lab Results  Component Value Date   NA 141 06/14/2019   K 4.2 06/14/2019   CO2 25 06/14/2019   GLUCOSE 107 06/14/2019   BUN 17 06/14/2019   CREATININE 0.64 06/14/2019   BILITOT 0.3 06/14/2019   ALKPHOS 114 04/09/2017   AST 20 06/14/2019   ALT 19 06/14/2019   PROT 6.1 06/14/2019   ALBUMIN 4.2 04/09/2017   CALCIUM 9.2 06/14/2019   Lab Results  Component Value Date   CHOL 288 (H) 06/14/2019   Lab Results  Component Value Date   HDL 54 06/14/2019   Lab Results  Component Value Date   LDLCALC 205 (H) 06/14/2019   Lab Results  Component Value Date   TRIG 139 06/14/2019   Lab Results  Component Value Date   CHOLHDL 5.3 (H) 06/14/2019   Lab Results  Component Value Date   HGBA1C 6.0 (A) 06/14/2019      Assessment & Plan:   Problem List Items Addressed This Visit      Cardiovascular and Mediastinum   Hot flashes    With current regimen.  Medications refilled.      Relevant Medications   venlafaxine XR (EFFEXOR-XR) 75 MG 24 hr capsule   venlafaxine XR (EFFEXOR-XR) 37.5 MG 24 hr capsule     Endocrine   IFG (impaired fasting glucose)    Due for A1c.  She did have a little bit of fruit juice today.        Other   Hyperlipidemia    Due to repeat lipids.      Relevant Orders   HgB A1c    COMPLETE METABOLIC PANEL WITH  GFR   Lipid Panel w/reflex Direct LDL    Other Visit Diagnoses    Impaired fasting glucose    -  Primary   Relevant Orders   HgB A1c   COMPLETE METABOLIC PANEL WITH GFR   Lipid Panel w/reflex Direct LDL   Screen for STD (sexually transmitted disease)       Relevant Orders   HIV Antibody (routine testing w rflx)   RPR   Skin lesion of back       Relevant Orders   Ambulatory referral to Dermatology     skin lesion on her right upper back just above the shoulder.  Initially I thought maybe it was a sebaceous cyst but it looks like what is inside actually has a very dark look I do not know if it is bruised or if the contents is more black.  But I am concerned enough that I do think it needs full excision.  We will try to see if we can get her in with dermatology.  If they are booked out we will try to get her in our office for full excision.   Meds ordered this encounter  Medications  . venlafaxine XR (EFFEXOR-XR) 75 MG 24 hr capsule    Sig: Take 1 capsule (75 mg total) by mouth daily with breakfast.    Dispense:  90 capsule    Refill:  1  . venlafaxine XR (EFFEXOR-XR) 37.5 MG 24 hr capsule    Sig: Take 1 capsule (37.5 mg total) by mouth daily with breakfast.    Dispense:  90 capsule    Refill:  1    Follow-up: Return in about 6 months (around 03/03/2021) for prediabetes.    Nani Gasser, MD

## 2020-09-02 NOTE — Assessment & Plan Note (Signed)
Due to repeat lipids.

## 2020-09-02 NOTE — Assessment & Plan Note (Signed)
With current regimen.  Medications refilled.

## 2020-09-02 NOTE — Assessment & Plan Note (Signed)
Due for A1c.  She did have a little bit of fruit juice today.

## 2020-09-03 LAB — COMPLETE METABOLIC PANEL WITH GFR
AG Ratio: 1.7 (calc) (ref 1.0–2.5)
ALT: 13 U/L (ref 6–29)
AST: 17 U/L (ref 10–35)
Albumin: 4.3 g/dL (ref 3.6–5.1)
Alkaline phosphatase (APISO): 108 U/L (ref 37–153)
BUN: 16 mg/dL (ref 7–25)
CO2: 26 mmol/L (ref 20–32)
Calcium: 10 mg/dL (ref 8.6–10.4)
Chloride: 106 mmol/L (ref 98–110)
Creat: 0.68 mg/dL (ref 0.50–1.05)
GFR, Est African American: 112 mL/min/{1.73_m2} (ref >=60)
GFR, Est Non African American: 96 mL/min/{1.73_m2} (ref >=60)
Globulin: 2.5 g/dL (calc) (ref 1.9–3.7)
Glucose, Bld: 101 mg/dL — ABNORMAL HIGH (ref 65–99)
Potassium: 3.7 mmol/L (ref 3.5–5.3)
Sodium: 142 mmol/L (ref 135–146)
Total Bilirubin: 0.5 mg/dL (ref 0.2–1.2)
Total Protein: 6.8 g/dL (ref 6.1–8.1)

## 2020-09-03 LAB — LIPID PANEL W/REFLEX DIRECT LDL
Cholesterol: 309 mg/dL — ABNORMAL HIGH (ref <200)
HDL: 51 mg/dL (ref >=50)
LDL Cholesterol (Calc): 221 mg/dL (calc) — ABNORMAL HIGH
Non-HDL Cholesterol (Calc): 258 mg/dL (calc) — ABNORMAL HIGH (ref <130)
Total CHOL/HDL Ratio: 6.1 (calc) — ABNORMAL HIGH (ref <5.0)
Triglycerides: 191 mg/dL — ABNORMAL HIGH (ref <150)

## 2020-09-03 LAB — HEMOGLOBIN A1C
Hgb A1c MFr Bld: 6.2 % of total Hgb — ABNORMAL HIGH (ref <5.7)
Mean Plasma Glucose: 131 mg/dL
eAG (mmol/L): 7.3 mmol/L

## 2020-09-03 LAB — RPR: RPR Ser Ql: NONREACTIVE

## 2020-09-03 LAB — HIV ANTIBODY (ROUTINE TESTING W REFLEX): HIV 1&2 Ab, 4th Generation: NONREACTIVE

## 2020-09-20 IMAGING — MG DIGITAL SCREENING BILAT W/ TOMO W/ CAD
6 of 10 series · 6 of 30 positions shown · non-contrast
Comparison: Previous exam(s).

CLINICAL DATA: Screening.

EXAM:
DIGITAL SCREENING BILATERAL MAMMOGRAM WITH TOMO AND CAD

[L CC synth-2D]
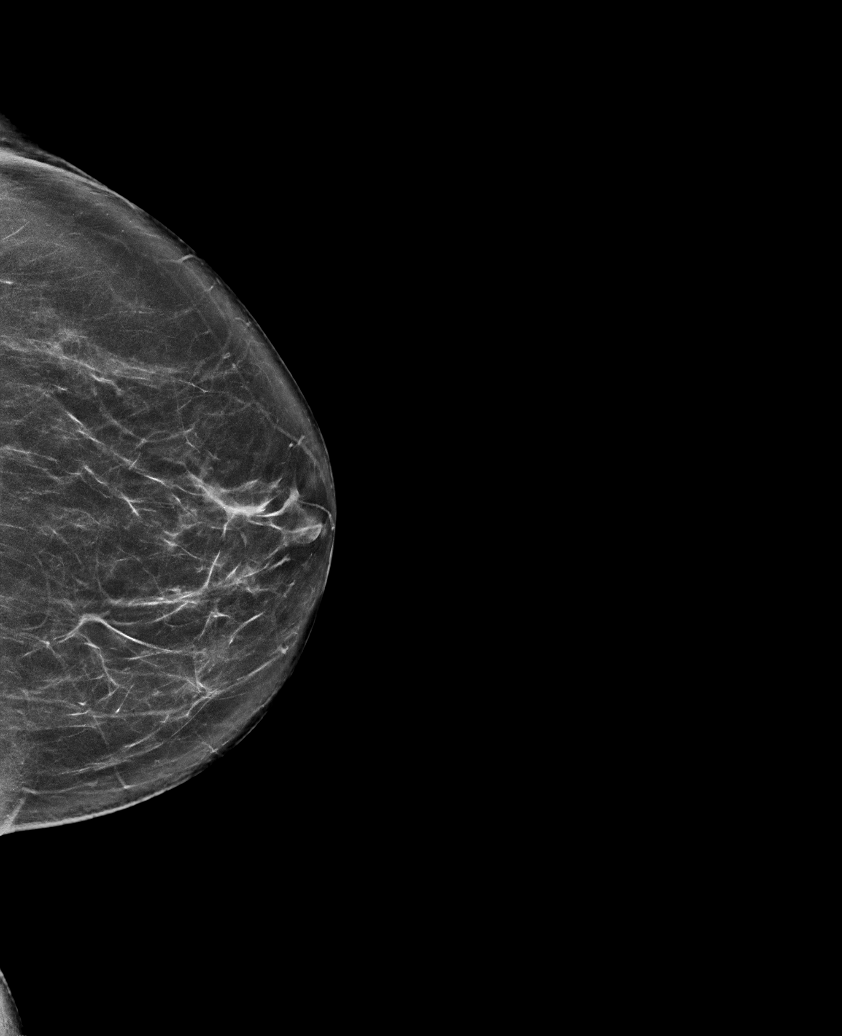

[R XCCM synth-2D]
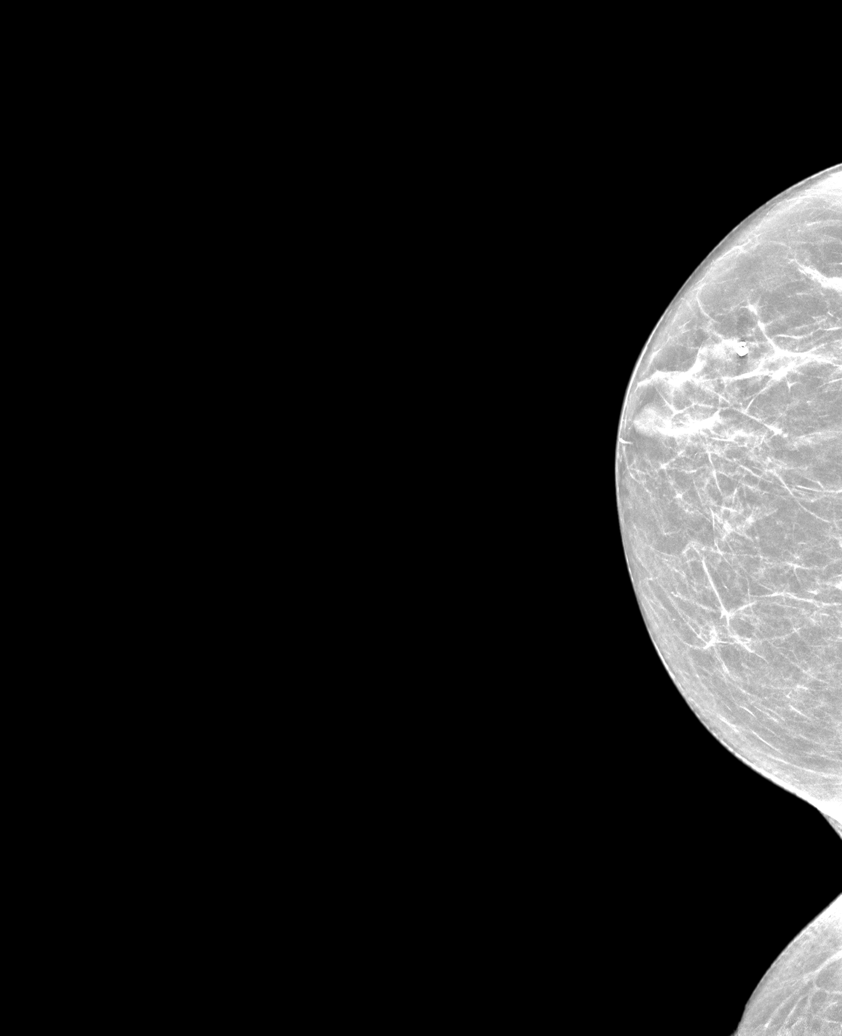

[L MLO synth-2D]
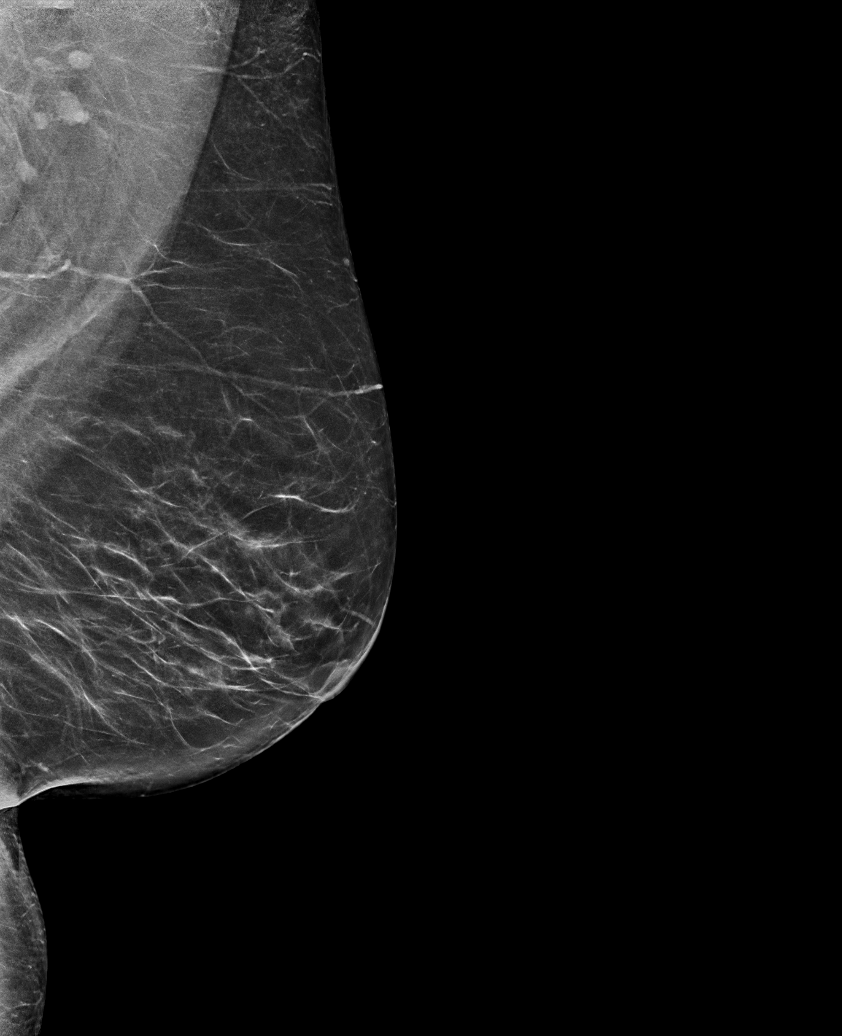

[R CC synth-2D]
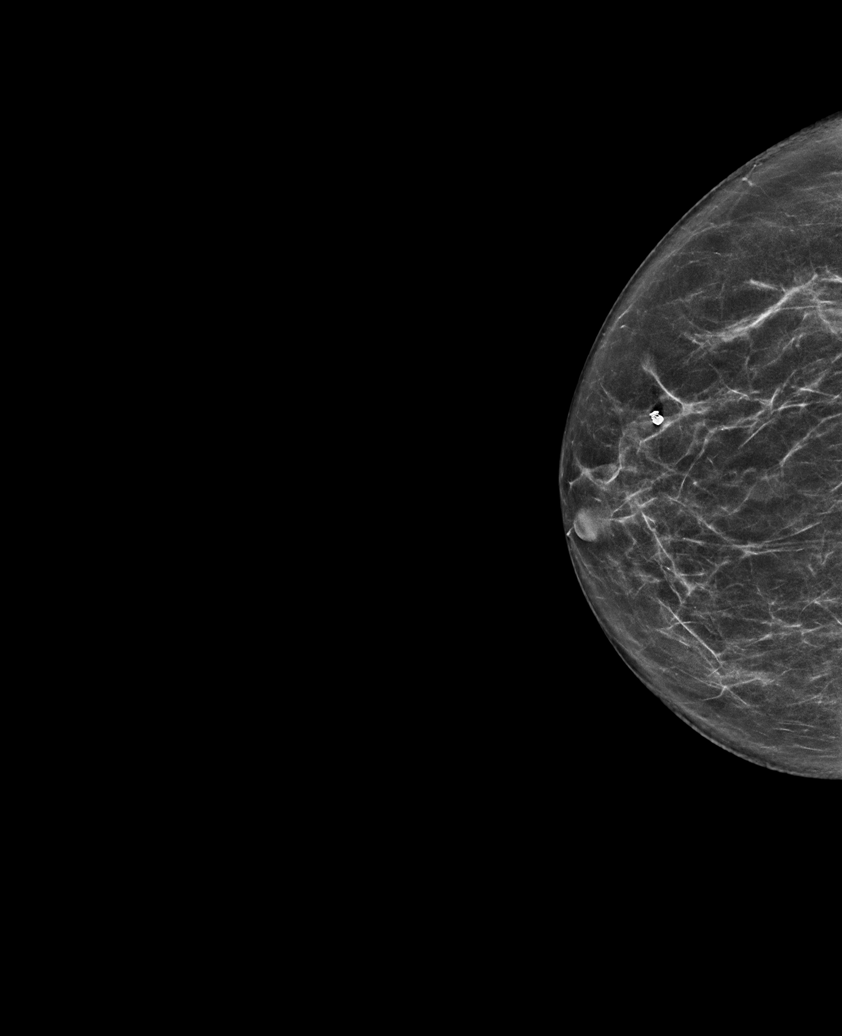

[R MLO synth-2D]
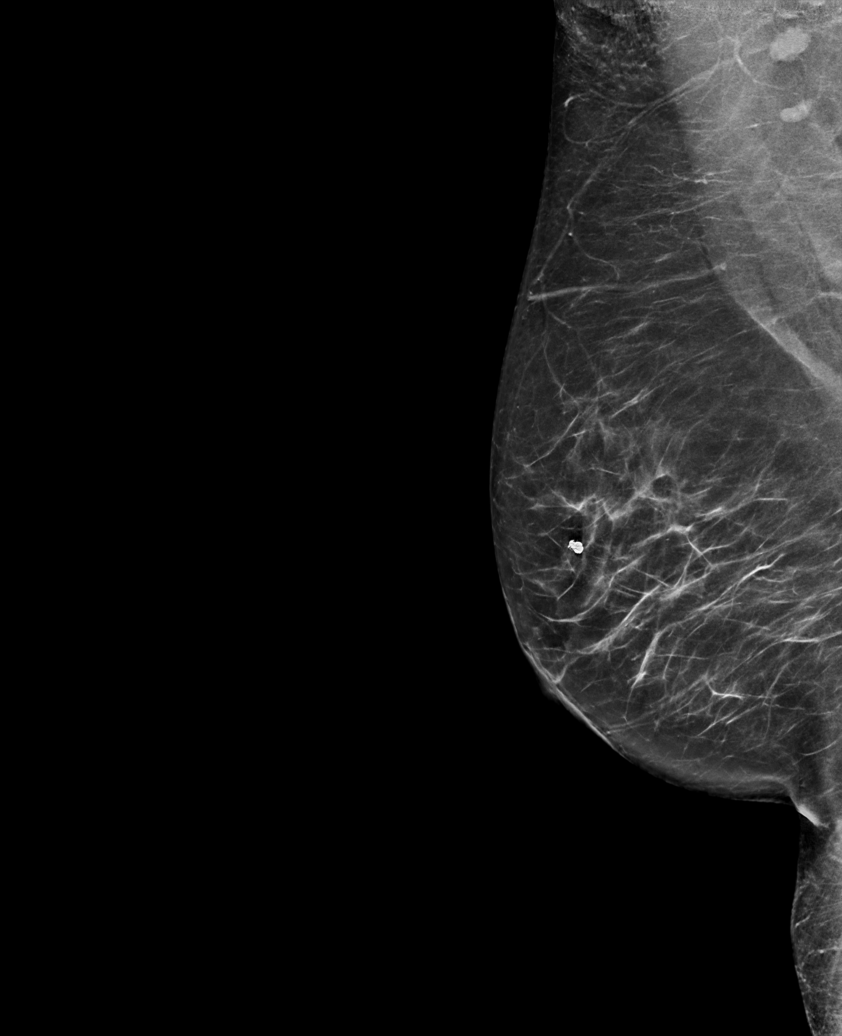

[R MLO tomo · tomo slice 36/71.0]
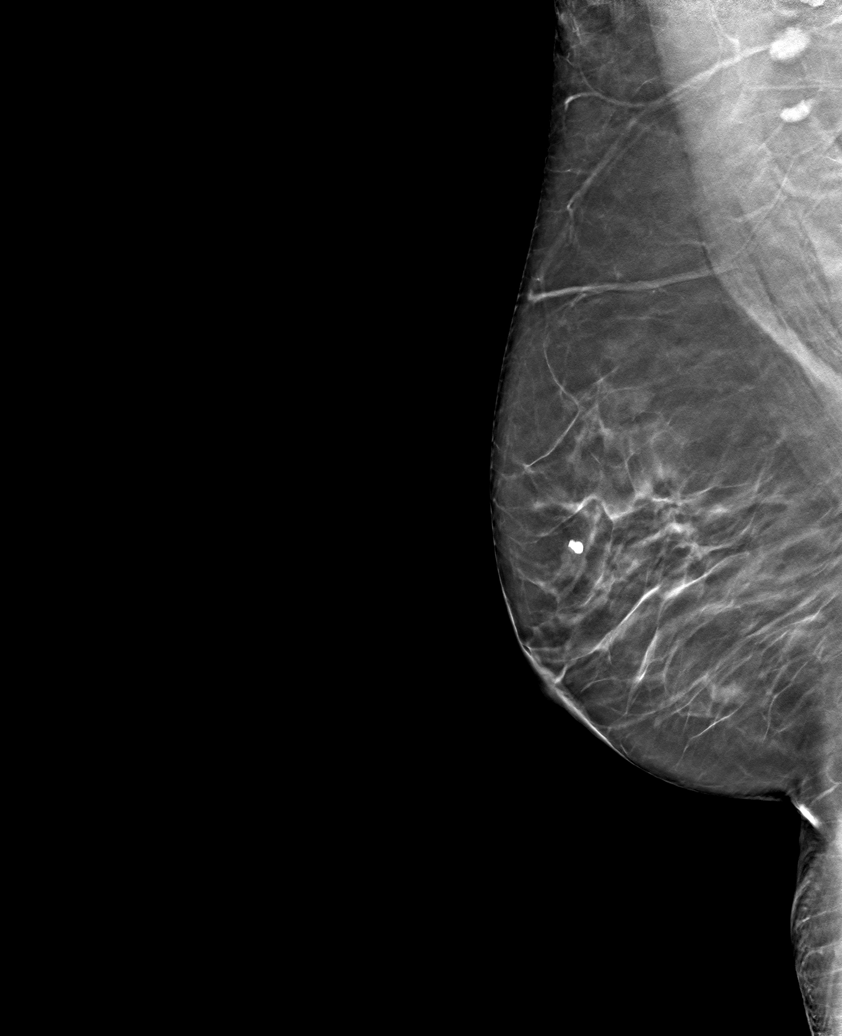

[6 of 30 positions shown; findings below may reference images not displayed]

ACR Breast Density Category b: There are scattered areas of
fibroglandular density.
FINDINGS: There are no findings suspicious for malignancy. Images were
processed with CAD.
IMPRESSION: No mammographic evidence of malignancy. A result letter of this
screening mammogram will be mailed directly to the patient.

RECOMMENDATION:
Screening mammogram in one year. (Code:[id number])

BI-RADS CATEGORY  1: Negative.

## 2021-03-03 ENCOUNTER — Ambulatory Visit: Payer: BC Managed Care – PPO | Admitting: Family Medicine

## 2021-03-17 ENCOUNTER — Other Ambulatory Visit: Payer: Self-pay | Admitting: Family Medicine

## 2021-03-17 DIAGNOSIS — R232 Flushing: Secondary | ICD-10-CM

## 2021-03-22 ENCOUNTER — Other Ambulatory Visit: Payer: Self-pay | Admitting: Family Medicine

## 2021-03-22 DIAGNOSIS — R232 Flushing: Secondary | ICD-10-CM

## 2021-12-16 ENCOUNTER — Encounter: Payer: Self-pay | Admitting: Family Medicine

## 2021-12-16 ENCOUNTER — Ambulatory Visit: Payer: BC Managed Care – PPO | Admitting: Family Medicine

## 2021-12-16 ENCOUNTER — Other Ambulatory Visit: Payer: Self-pay

## 2021-12-16 VITALS — BP 158/74 | HR 71 | Ht 61.0 in | Wt 132.0 lb

## 2021-12-16 DIAGNOSIS — R7301 Impaired fasting glucose: Secondary | ICD-10-CM

## 2021-12-16 DIAGNOSIS — R030 Elevated blood-pressure reading, without diagnosis of hypertension: Secondary | ICD-10-CM

## 2021-12-16 DIAGNOSIS — M79672 Pain in left foot: Secondary | ICD-10-CM

## 2021-12-16 DIAGNOSIS — Z0000 Encounter for general adult medical examination without abnormal findings: Secondary | ICD-10-CM

## 2021-12-16 DIAGNOSIS — Z1231 Encounter for screening mammogram for malignant neoplasm of breast: Secondary | ICD-10-CM

## 2021-12-16 NOTE — Patient Instructions (Signed)
Please check with your insurance.  Your colon cancer screening may actually be free.  If it is we are happy to make your referral. ?

## 2021-12-16 NOTE — Progress Notes (Signed)
? ?Established Patient Office Visit ? ?Subjective:  ?Patient ID: Kelli Frank, female    DOB: 11/08/1960  Age: 61 y.o. MRN: [id number] ? ?CC:  ?Chief Complaint  ?Patient presents with  ? Annual Exam  ? ? ?HPI ?Kelli Frank presents for CPE.  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  She did come off of her Effexor for her hot flashes she says they are much more tolerable now.  She started exercising again and she is doing her Pilates and walks about 2 to 4 miles per day.  She has had some left foot pain on and off for about a year ever since she twisted her foot.  She says when it flares up she will get some swelling over the top part of her foot and little bit medially and it will actually swell though she has not noticed any erythema sometimes it will actually radiate up into her lower leg ibuprofen does seem to help and after she doses with ibuprofen it usually goes away after about a week.  She has not noticed any redness over the skin.  She does have a brother with gout. ? ?Impaired fasting glucose-no increased thirst or urination. No symptoms consistent with hypoglycemia.  ? ?Past Medical History:  ?Diagnosis Date  ? Ovarian cyst, left   ? Smoker   ? VIN III (vulvar intraepithelial neoplasia III)   ? 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 ? ?Past Surgical History:  ?Procedure Laterality Date  ? TONSILLECTOMY  1970  ? TUBAL LIGATION  2009  ? bilateral  ? VULVAR LESION REMOVAL  06/23/2011  ? ? ?Family History  ?Problem Relation Age of Onset  ? Heart attack Father   ? Hypertension Father   ? Hyperlipidemia Father   ? Hyperlipidemia Brother   ? Thyroid disease Brother   ? Cancer Other   ?     grandfather/colorectal  ? ? ?Social History  ? ?Socioeconomic History  ? Marital status: Single  ?  Spouse name: Not on file  ? Number of children: Not on file  ? Years of education: Not on file  ? Highest education level: Not on file  ?Occupational History  ? Not on file  ?Tobacco  Use  ? Smoking status: Former  ?  Types: Cigarettes  ? Smokeless tobacco: Never  ? Tobacco comments:  ?  hx of 25 packyears, now smoking 5-6 cig/day  ?Substance and Sexual Activity  ? Alcohol use: Yes  ?  Comment: occasional  ? Drug use: No  ? Sexual activity: Not Currently  ?  Partners: Male  ?Other Topics Concern  ? Not on file  ?Social History Narrative  ? Not on file  ? ?Social Determinants of Health  ? ?Financial Resource Strain: Not on file  ?Food Insecurity: Not on file  ?Transportation Needs: Not on file  ?Physical Activity: Not on file  ?Stress: Not on file  ?Social Connections: Not on file  ?Intimate Partner Violence: Not on file  ? ? ?Outpatient Medications Prior to Visit  ?Medication Sig Dispense Refill  ? AMBULATORY NON FORMULARY MEDICATION Medication Name: Juice plus    ? venlafaxine XR (EFFEXOR-XR) 37.5 MG 24 hr capsule Take 1 capsule (37.5 mg total) by mouth daily with breakfast. 90 capsule 1  ? venlafaxine XR (EFFEXOR-XR) 75 MG 24 hr capsule Take 1 capsule (75 mg total) by mouth daily with breakfast. 30 DAY SUPPLY GIVEN.PLEASE SCHEDULE AN APPOINTMENT FOR REFILLS 30  capsule 0  ? ?No facility-administered medications prior to visit.  ? ? ?Allergies  ?Allergen Reactions  ? Penicillins Other (See Comments)  ?  swelling  ? Atorvastatin Other (See Comments)  ? Tetanus-Diphtheria Toxoids Td   ? ? ?ROS ?Review of Systems ? ?  ?Objective:  ?  ?Physical Exam ?Exam conducted with a chaperone present.  ?Constitutional:   ?   Appearance: Normal appearance. She is well-developed.  ?HENT:  ?   Head: Normocephalic and atraumatic.  ?   Right Ear: Tympanic membrane, ear canal and external ear normal.  ?   Left Ear: Tympanic membrane, ear canal and external ear normal.  ?   Nose: Nose normal.  ?   Mouth/Throat:  ?   Mouth: Mucous membranes are moist.  ?   Pharynx: Oropharynx is clear. No oropharyngeal exudate or posterior oropharyngeal erythema.  ?Eyes:  ?   Conjunctiva/sclera: Conjunctivae normal.  ?   Pupils: Pupils  are equal, round, and reactive to light.  ?Neck:  ?   Thyroid: No thyromegaly.  ?Cardiovascular:  ?   Rate and Rhythm: Normal rate and regular rhythm.  ?   Heart sounds: Normal heart sounds.  ?Pulmonary:  ?   Effort: Pulmonary effort is normal.  ?   Breath sounds: Normal breath sounds. No wheezing.  ?Chest:  ?   Chest wall: No mass.  ?Breasts: ?   Right: Normal.  ?   Left: Normal.  ?Abdominal:  ?   General: Abdomen is flat.  ?   Palpations: Abdomen is soft.  ?Musculoskeletal:  ?   Cervical back: Neck supple.  ?Lymphadenopathy:  ?   Cervical: No cervical adenopathy.  ?   Upper Body:  ?   Right upper body: No axillary adenopathy.  ?   Left upper body: No axillary adenopathy.  ?Skin: ?   General: Skin is warm and dry.  ?Neurological:  ?   General: No focal deficit present.  ?   Mental Status: She is alert and oriented to person, place, and time.  ?Psychiatric:     ?   Mood and Affect: Mood normal.     ?   Behavior: Behavior normal.  ? ? ?BP (!) 158/74   Pulse 71   Ht 5\' 1"  (1.549 m)   Wt 132 lb (59.9 kg)   LMP 06/01/2012   SpO2 99%   BMI 24.94 kg/m?  ?Wt Readings from Last 3 Encounters:  ?12/16/21 132 lb (59.9 kg)  ?09/02/20 123 lb (55.8 kg)  ?08/09/19 140 lb (63.5 kg)  ? ? ? ?Health Maintenance Due  ?Topic Date Due  ? Hepatitis C Screening  Never done  ? COLONOSCOPY (Pts 45-63yrs Insurance coverage will need to be confirmed)  06/19/2017  ? MAMMOGRAM  08/08/2021  ? ? ?There are no preventive care reminders to display for this patient. ? ?Lab Results  ?Component Value Date  ? TSH 1.374 05/02/2008  ? ?Lab Results  ?Component Value Date  ? WBC 11.6 (H) 05/21/2014  ? HGB 13.7 05/21/2014  ? HCT 41.3 05/21/2014  ? MCV 91.8 05/21/2014  ? PLT 343 05/21/2014  ? ?Lab Results  ?Component Value Date  ? NA 142 09/02/2020  ? K 3.7 09/02/2020  ? CO2 26 09/02/2020  ? GLUCOSE 101 (H) 09/02/2020  ? BUN 16 09/02/2020  ? CREATININE 0.68 09/02/2020  ? BILITOT 0.5 09/02/2020  ? ALKPHOS 114 04/09/2017  ? AST 17 09/02/2020  ? ALT 13  09/02/2020  ? PROT 6.8 09/02/2020  ?  ALBUMIN 4.2 04/09/2017  ? CALCIUM 10.0 09/02/2020  ? ?Lab Results  ?Component Value Date  ? CHOL 309 (H) 09/02/2020  ? ?Lab Results  ?Component Value Date  ? HDL 51 09/02/2020  ? ?Lab Results  ?Component Value Date  ? LDLCALC 221 (H) 09/02/2020  ? ?Lab Results  ?Component Value Date  ? TRIG 191 (H) 09/02/2020  ? ?Lab Results  ?Component Value Date  ? CHOLHDL 6.1 (H) 09/02/2020  ? ?Lab Results  ?Component Value Date  ? HGBA1C 6.2 (H) 09/02/2020  ? ? ?  ?Assessment & Plan:  ? ?Problem List Items Addressed This Visit   ? ?  ? Endocrine  ? IFG (impaired fasting glucose) - Primary  ?  ? Other  ? Left foot pain  ? Relevant Orders  ? Uric acid  ? ?Other Visit Diagnoses   ? ? Elevated BP without diagnosis of hypertension      ? Wellness examination      ? Relevant Orders  ? Hepatitis C Antibody  ? Lipid Panel w/reflex Direct LDL  ? COMPLETE METABOLIC PANEL WITH GFR  ? CBC  ? Hemoglobin A1c  ? Screening mammogram for breast cancer      ? Relevant Orders  ? MM 3D SCREEN BREAST BILATERAL  ? ?  ? ? ?CPE  ?Keep up a regular exercise program and make sure you are eating a healthy diet ?Try to eat 4 servings of dairy a day, or if you are lactose intolerant take a calcium with vitamin D daily.  ?Your vaccines are up to date.  ? ?Left foot pain-exam is normal today.  We did discuss at least checking a uric acid on her blood work.  If symptoms continue we can always consider an x-ray for further evaluation.  In the short-term did recommend that she start wearing her custom orthotic inserts again and see if that is helpful.  She does have fairly high arches and would benefit from support.  She says it does feel better in flat shoes. ? ?Elevated blood pressure-plan to recheck BP. ? ?She is interested in getting a colonoscopy but says she cannot afford it right now. ? ? ?No orders of the defined types were placed in this encounter. ? ? ?Follow-up: Return in about 2 weeks (around 12/30/2021) for blood  pressure check with the nurse .  ? ? ?Nani Gasseratherine Aspasia Rude, MD ?

## 2021-12-23 LAB — COMPLETE METABOLIC PANEL WITH GFR
AG Ratio: 1.9 (calc) (ref 1.0–2.5)
ALT: 11 U/L (ref 6–29)
AST: 13 U/L (ref 10–35)
Albumin: 4 g/dL (ref 3.6–5.1)
Alkaline phosphatase (APISO): 99 U/L (ref 37–153)
BUN: 17 mg/dL (ref 7–25)
CO2: 22 mmol/L (ref 20–32)
Calcium: 9.3 mg/dL (ref 8.6–10.4)
Chloride: 111 mmol/L — ABNORMAL HIGH (ref 98–110)
Creat: 0.68 mg/dL (ref 0.50–1.03)
Globulin: 2.1 g/dL (calc) (ref 1.9–3.7)
Glucose, Bld: 100 mg/dL — ABNORMAL HIGH (ref 65–99)
Potassium: 3.9 mmol/L (ref 3.5–5.3)
Sodium: 143 mmol/L (ref 135–146)
Total Bilirubin: 0.2 mg/dL (ref 0.2–1.2)
Total Protein: 6.1 g/dL (ref 6.1–8.1)
eGFR: 100 mL/min/{1.73_m2} (ref >=60)

## 2021-12-23 LAB — LIPID PANEL W/REFLEX DIRECT LDL
Cholesterol: 257 mg/dL — ABNORMAL HIGH (ref <200)
HDL: 44 mg/dL — ABNORMAL LOW (ref >=50)
LDL Cholesterol (Calc): 189 mg/dL (calc) — ABNORMAL HIGH
Non-HDL Cholesterol (Calc): 213 mg/dL (calc) — ABNORMAL HIGH (ref <130)
Total CHOL/HDL Ratio: 5.8 (calc) — ABNORMAL HIGH (ref <5.0)
Triglycerides: 111 mg/dL (ref <150)

## 2021-12-23 LAB — URIC ACID: Uric Acid, Serum: 5.3 mg/dL (ref 2.5–7.0)

## 2021-12-23 LAB — CBC
HCT: 41.5 % (ref 35.0–45.0)
Hemoglobin: 13.8 g/dL (ref 11.7–15.5)
MCH: 31 pg (ref 27.0–33.0)
MCHC: 33.3 g/dL (ref 32.0–36.0)
MCV: 93.3 fL (ref 80.0–100.0)
MPV: 11.3 fL (ref 7.5–12.5)
Platelets: 273 10*3/uL (ref 140–400)
RBC: 4.45 10*6/uL (ref 3.80–5.10)
RDW: 13.1 % (ref 11.0–15.0)
WBC: 8.4 10*3/uL (ref 3.8–10.8)

## 2021-12-23 LAB — HEPATITIS C ANTIBODY
Hepatitis C Ab: NONREACTIVE
SIGNAL TO CUT-OFF: 0.29 (ref <1.00)

## 2021-12-23 LAB — HEMOGLOBIN A1C
Hgb A1c MFr Bld: 5.9 % of total Hgb — ABNORMAL HIGH (ref <5.7)
Mean Plasma Glucose: 123 mg/dL
eAG (mmol/L): 6.8 mmol/L

## 2021-12-23 NOTE — Progress Notes (Signed)
Uric acid normal.  ?WBC and hemoglobin look good.  ?A1C has improved at 5.9 and still in pre-diabetes range.  ?Kidney and liver look good.  ? ?Your TG have improved which is great.  ? ?Your LDL has improved but CV risk is still high at 13.5 percent.  ? ?Would you consider another trial of statin to lower CV risk?  ? ?Marland Kitchen.The 10-year ASCVD risk score (Arnett DK, et al., 2019) is: 13.5% ?  Values used to calculate the score: ?    Age: 61 years ?    Sex: Female ?    Is Non-Hispanic African American: No ?    Diabetic: No ?    Tobacco smoker: Yes ?    Systolic Blood Pressure: 158 mmHg ?    Is BP treated: No ?    HDL Cholesterol: 44 mg/dL ?    Total Cholesterol: 257 mg/dL ? ?Tandy Gaw PA-C ?

## 2021-12-24 ENCOUNTER — Telehealth (HOSPITAL_BASED_OUTPATIENT_CLINIC_OR_DEPARTMENT_OTHER): Payer: Self-pay

## 2021-12-28 NOTE — Progress Notes (Signed)
Hi Kelli Frank, Your LDL does look about 30 point better than last time which is great but it is still very high. Your 10 year risk of having a stroke or heart attack is greater than 10% so I would highly recommend a statin to lower your cholesterol and your risk.  Please consider starting a medication. We would then recheck your labs in 8-12 weeks. We could try Crestor.  It goes through a different pathway than the atorvastatin you tried in the past.  ? ?The 10-year ASCVD risk score (Arnett DK, et al., 2019) is: 13.5% ?  Values used to calculate the score: ?    Age: 61 years ?    Sex: Female ?    Is Non-Hispanic African American: No ?    Diabetic: No ?    Tobacco smoker: Yes ?    Systolic Blood Pressure: 158 mmHg ?    Is BP treated: No ?    HDL Cholesterol: 44 mg/dL ?    Total Cholesterol: 257 mg/dL ?

## 2022-01-12 ENCOUNTER — Encounter (HOSPITAL_BASED_OUTPATIENT_CLINIC_OR_DEPARTMENT_OTHER): Payer: Self-pay

## 2022-01-12 ENCOUNTER — Ambulatory Visit (HOSPITAL_BASED_OUTPATIENT_CLINIC_OR_DEPARTMENT_OTHER)
Admission: RE | Admit: 2022-01-12 | Discharge: 2022-01-12 | Disposition: A | Discharge status: Home / Self Care | Payer: BC Managed Care – PPO | Source: Ambulatory Visit | Attending: Family Medicine | Admitting: Family Medicine

## 2022-01-12 DIAGNOSIS — Z1231 Encounter for screening mammogram for malignant neoplasm of breast: Secondary | ICD-10-CM | POA: Diagnosis present

## 2022-01-13 NOTE — Progress Notes (Signed)
Please call patient. Normal mammogram.  Repeat in 1 year.  

## 2022-03-04 ENCOUNTER — Telehealth: Payer: Self-pay | Admitting: Family Medicine

## 2022-03-04 NOTE — Telephone Encounter (Signed)
Pt called and said that she has been experiencing high bp most recent reading was 180/(not sure this number) along with headaches. She said she will take it again this afternoon. There are no available appts except acutes the rest of the week. Please advise the best way to schedule the pt.

## 2022-03-04 NOTE — Telephone Encounter (Signed)
Pt scheduled  

## 2022-04-06 ENCOUNTER — Encounter: Payer: Self-pay | Admitting: Physician Assistant

## 2022-04-06 ENCOUNTER — Ambulatory Visit (INDEPENDENT_AMBULATORY_CARE_PROVIDER_SITE_OTHER): Payer: BC Managed Care – PPO

## 2022-04-06 ENCOUNTER — Ambulatory Visit: Payer: BC Managed Care – PPO | Admitting: Physician Assistant

## 2022-04-06 VITALS — BP 152/69 | HR 73 | Ht 61.0 in | Wt 133.0 lb

## 2022-04-06 DIAGNOSIS — I10 Essential (primary) hypertension: Secondary | ICD-10-CM | POA: Diagnosis not present

## 2022-04-06 DIAGNOSIS — M79672 Pain in left foot: Secondary | ICD-10-CM

## 2022-04-06 MED ORDER — HYDROCHLOROTHIAZIDE 12.5 MG PO TABS: 12.5000 mg | ORAL_TABLET | Freq: Every day | ORAL | 1 refills | Status: DC

## 2022-04-06 NOTE — Progress Notes (Signed)
   Acute Office Visit  Subjective:     Patient ID: Kelli Frank, female    DOB: [birth year], 61 y.o.   MRN: [id number]  Chief Complaint  Patient presents with   Hypertension    HPI Patient is in today to follow up on blood pressure.   She has noticed that BP has been elevated in office a few times and then at the dentist this week. She has stopped sodas and eating better but BP still elevated. No CP, palpitations, headaches or vision changes. She is ready to be treated.   She did land on the ball of her left foot hard about one month ago. She had pain and swelling over 2nd and 3rd dorsal foot. She wore a boot she had at the house for a week or so which did help. Swelling improved but still pain with walking in regular shoes. She wants this evaluated.   ROS See HPI.      Objective:    BP (!) 152/69   Pulse 73   Ht 5\' 1"  (1.549 m)   Wt 133 lb (60.3 kg)   LMP 06/01/2012   SpO2 97%   BMI 25.13 kg/m  BP Readings from Last 3 Encounters:  04/06/22 (!) 152/69  12/16/21 (!) 158/74  09/02/20 136/70   Wt Readings from Last 3 Encounters:  04/06/22 133 lb (60.3 kg)  12/16/21 132 lb (59.9 kg)  09/02/20 123 lb (55.8 kg)      Physical Exam Constitutional:      Appearance: Normal appearance.  HENT:     Head: Normocephalic.  Neck:     Vascular: No carotid bruit.  Cardiovascular:     Rate and Rhythm: Normal rate and regular rhythm.     Pulses: Normal pulses.     Heart sounds: Normal heart sounds.  Pulmonary:     Effort: Pulmonary effort is normal.     Breath sounds: Normal breath sounds.  Musculoskeletal:     Right lower leg: No edema.     Left lower leg: No edema.     Comments: Left foot:  Pain to palpation over 2nd mid shaft metatarsal No swelling Some appearance of bruising over metatarsal 5/5 strength NROM No pain over fascia or heel  Neurological:     General: No focal deficit present.     Mental Status: She is alert and oriented to person, place, and time.   Psychiatric:        Mood and Affect: Mood normal.           Assessment & Plan:  12/15/21Marland KitchenOnyinyechi was seen today for hypertension.  Diagnoses and all orders for this visit:  Primary hypertension -     hydrochlorothiazide (HYDRODIURIL) 12.5 MG tablet; Take 1 tablet (12.5 mg total) by mouth daily.  Left foot pain -     DG Foot Complete Left; Future   Discussed HTN HO given Last labs kidney looked great HCTZ started Discussed side effects Encouraged to get BP cuff at home Follow up with PCP in 4 weeks  Continues to have pain over 2nd metatarsal Xray ordered Will call patient with results and discuss going back in boot She is wearing sandles today and at the very least needs to wear good supportive shoe    Return in about 4 weeks (around 05/04/2022) for F/U HTN.  05/06/2022, PA-C

## 2022-04-06 NOTE — Patient Instructions (Signed)
Hypertension, Adult High blood pressure (hypertension) is when the force of blood pumping through the arteries is too strong. The arteries are the blood vessels that carry blood from the heart throughout the body. Hypertension forces the heart to work harder to pump blood and may cause arteries to become narrow or stiff. Untreated or uncontrolled hypertension can lead to a heart attack, heart failure, a stroke, kidney disease, and other problems. A blood pressure reading consists of a higher number over a lower number. Ideally, your blood pressure should be below 120/80. The first ("top") number is called the systolic pressure. It is a measure of the pressure in your arteries as your heart beats. The second ("bottom") number is called the diastolic pressure. It is a measure of the pressure in your arteries as the heart relaxes. What are the causes? The exact cause of this condition is not known. There are some conditions that result in high blood pressure. What increases the risk? Certain factors may make you more likely to develop high blood pressure. Some of these risk factors are under your control, including: Smoking. Not getting enough exercise or physical activity. Being overweight. Having too much fat, sugar, calories, or salt (sodium) in your diet. Drinking too much alcohol. Other risk factors include: Having a personal history of heart disease, diabetes, high cholesterol, or kidney disease. Stress. Having a family history of high blood pressure and high cholesterol. Having obstructive sleep apnea. Age. The risk increases with age. What are the signs or symptoms? High blood pressure may not cause symptoms. Very high blood pressure (hypertensive crisis) may cause: Headache. Fast or irregular heartbeats (palpitations). Shortness of breath. Nosebleed. Nausea and vomiting. Vision changes. Severe chest pain, dizziness, and seizures. How is this diagnosed? This condition is diagnosed by  measuring your blood pressure while you are seated, with your arm resting on a flat surface, your legs uncrossed, and your feet flat on the floor. The cuff of the blood pressure monitor will be placed directly against the skin of your upper arm at the level of your heart. Blood pressure should be measured at least twice using the same arm. Certain conditions can cause a difference in blood pressure between your right and left arms. If you have a high blood pressure reading during one visit or you have normal blood pressure with other risk factors, you may be asked to: Return on a different day to have your blood pressure checked again. Monitor your blood pressure at home for 1 week or longer. If you are diagnosed with hypertension, you may have other blood or imaging tests to help your health care provider understand your overall risk for other conditions. How is this treated? This condition is treated by making healthy lifestyle changes, such as eating healthy foods, exercising more, and reducing your alcohol intake. You may be referred for counseling on a healthy diet and physical activity. Your health care provider may prescribe medicine if lifestyle changes are not enough to get your blood pressure under control and if: Your systolic blood pressure is above 130. Your diastolic blood pressure is above 80. Your personal target blood pressure may vary depending on your medical conditions, your age, and other factors. Follow these instructions at home: Eating and drinking  Eat a diet that is high in fiber and potassium, and low in sodium, added sugar, and fat. An example of this eating plan is called the DASH diet. DASH stands for Dietary Approaches to Stop Hypertension. To eat this way: Eat   plenty of fresh fruits and vegetables. Try to fill one half of your plate at each meal with fruits and vegetables. Eat whole grains, such as whole-wheat pasta, brown rice, or whole-grain bread. Fill about one  fourth of your plate with whole grains. Eat or drink low-fat dairy products, such as skim milk or low-fat yogurt. Avoid fatty cuts of meat, processed or cured meats, and poultry with skin. Fill about one fourth of your plate with lean proteins, such as fish, chicken without skin, beans, eggs, or tofu. Avoid pre-made and processed foods. These tend to be higher in sodium, added sugar, and fat. Reduce your daily sodium intake. Many people with hypertension should eat less than 1,500 mg of sodium a day. Do not drink alcohol if: Your health care provider tells you not to drink. You are pregnant, may be pregnant, or are planning to become pregnant. If you drink alcohol: Limit how much you have to: 0-1 drink a day for women. 0-2 drinks a day for men. Know how much alcohol is in your drink. In the [place] one drink equals one 12 oz bottle of beer (355 mL), one 5 oz glass of wine (148 mL), or one 1 oz glass of hard liquor (44 mL). Lifestyle  Work with your health care provider to maintain a healthy body weight or to lose weight. Ask what an ideal weight is for you. Get at least 30 minutes of exercise that causes your heart to beat faster (aerobic exercise) most days of the week. Activities may include walking, swimming, or biking. Include exercise to strengthen your muscles (resistance exercise), such as Pilates or lifting weights, as part of your weekly exercise routine. Try to do these types of exercises for 30 minutes at least 3 days a week. Do not use any products that contain nicotine or tobacco. These products include cigarettes, chewing tobacco, and vaping devices, such as e-cigarettes. If you need help quitting, ask your health care provider. Monitor your blood pressure at home as told by your health care provider. Keep all follow-up visits. This is important. Medicines Take over-the-counter and prescription medicines only as told by your health care provider. Follow directions carefully. Blood  pressure medicines must be taken as prescribed. Do not skip doses of blood pressure medicine. Doing this puts you at risk for problems and can make the medicine less effective. Ask your health care provider about side effects or reactions to medicines that you should watch for. Contact a health care provider if you: Think you are having a reaction to a medicine you are taking. Have headaches that keep coming back (recurring). Feel dizzy. Have swelling in your ankles. Have trouble with your vision. Get help right away if you: Develop a severe headache or confusion. Have unusual weakness or numbness. Feel faint. Have severe pain in your chest or abdomen. Vomit repeatedly. Have trouble breathing. These symptoms may be an emergency. Get help right away. Call 911. Do not wait to see if the symptoms will go away. Do not drive yourself to the hospital. Summary Hypertension is when the force of blood pumping through your arteries is too strong. If this condition is not controlled, it may put you at risk for serious complications. Your personal target blood pressure may vary depending on your medical conditions, your age, and other factors. For most people, a normal blood pressure is less than 120/80. Hypertension is treated with lifestyle changes, medicines, or a combination of both. Lifestyle changes include losing weight, eating a healthy,   low-sodium diet, exercising more, and limiting alcohol. This information is not intended to replace advice given to you by your health care provider. Make sure you discuss any questions you have with your health care provider. Document Revised: 07/15/2021 Document Reviewed: 07/15/2021 Elsevier Patient Education  2023 [employer] Inc.  

## 2022-04-07 NOTE — Progress Notes (Signed)
No evidence of fracture. If the boot or good support shoe takes the pressure off and not painful to walk wear that for another 2 weeks and see if pain resolves.

## 2022-05-05 ENCOUNTER — Telehealth: Payer: Self-pay

## 2022-05-05 NOTE — Telephone Encounter (Signed)
Pt lvm stating she received a bill from [facility] for services on 12/22/21 in the amount of $74.25. Per [facility], Dr. Linford Arnold needs to update the billing code for hemoglobin lab.   Message forwarded to Dr. Linford Arnold & Marylene Land.

## 2022-05-08 ENCOUNTER — Encounter: Payer: Self-pay | Admitting: Family Medicine

## 2022-05-08 ENCOUNTER — Ambulatory Visit: Payer: BC Managed Care – PPO | Admitting: Family Medicine

## 2022-05-08 VITALS — BP 177/85 | HR 91 | Ht 61.0 in | Wt 131.0 lb

## 2022-05-08 DIAGNOSIS — F5101 Primary insomnia: Secondary | ICD-10-CM

## 2022-05-08 DIAGNOSIS — G4700 Insomnia, unspecified: Secondary | ICD-10-CM | POA: Insufficient documentation

## 2022-05-08 DIAGNOSIS — I10 Essential (primary) hypertension: Secondary | ICD-10-CM

## 2022-05-08 MED ORDER — VALSARTAN-HYDROCHLOROTHIAZIDE 160-25 MG PO TABS: 1.0000 | ORAL_TABLET | Freq: Every day | ORAL | 3 refills | Status: DC

## 2022-05-08 MED ORDER — TRAZODONE HCL 50 MG PO TABS: 25.0000 mg | ORAL_TABLET | Freq: Every evening | ORAL | 3 refills | Status: DC | PRN

## 2022-05-08 NOTE — Assessment & Plan Note (Signed)
He has a pretty good sleep hygiene routine.  Encouraged her to continue with that and we discussed options for medication treatment.  We will start with trazodone discussed how the medication works and what symptoms to monitor for.  Start with half a tab and okay to very gradually increase if needed.

## 2022-05-08 NOTE — Progress Notes (Signed)
   Established Patient Office Visit  Subjective   Patient ID: Kelli Frank, female    DOB: 1961/03/16  Age: 61 y.o. MRN: [id number]  Chief Complaint  Patient presents with   Follow-up    HPI  F/U BP - new start low dose Hctz.  She has been tolerating it well without any side effects.  She says in fact she has been trying to do a little better with her diet she has not been able to exercise more because of her left foot she has it in a postop shoe right now but it is getting better.  The postop shoe has been helpful.  She is down a couple of pounds.  He does eat a low-salt diet.  She also wanted to discuss sleep problems.  She has had them really on and off for years.  She says she has difficulty falling asleep and staying asleep cumulatively she probably gets about 4 hours.  She has tried melatonin.  She does have consistent bedtime and wake time.  She is avoid screens before bedtime.  She avoids caffeine.  Would like to discuss insomnia as well.    ROS    Objective:     BP (!) 177/85   Pulse 91   Ht 5\' 1"  (1.549 m)   Wt 131 lb (59.4 kg)   LMP 06/01/2012   SpO2 95%   BMI 24.75 kg/m    Physical Exam Vitals and nursing note reviewed.  Constitutional:      Appearance: She is well-developed.  HENT:     Head: Normocephalic and atraumatic.  Cardiovascular:     Rate and Rhythm: Normal rate and regular rhythm.     Heart sounds: Normal heart sounds.  Pulmonary:     Effort: Pulmonary effort is normal.     Breath sounds: Normal breath sounds.  Skin:    General: Skin is warm and dry.  Neurological:     Mental Status: She is alert and oriented to person, place, and time.  Psychiatric:        Behavior: Behavior normal.      No results found for any visits on 05/08/22.    The 10-year ASCVD risk score (Arnett DK, et al., 2019) is: 23.2%    Assessment & Plan:   Problem List Items Addressed This Visit       Cardiovascular and Mediastinum   Primary hypertension -  Primary    Discontinue HCTZ and switch to losartan HCT.  Follow-up in 2 to 3 weeks for nurse visit.  We will need a BMP at that time.  Lab ordered today.  We can make adjustments to her regimen at that time.      Relevant Medications   valsartan-hydrochlorothiazide (DIOVAN-HCT) 160-25 MG tablet   Other Relevant Orders   BASIC METABOLIC PANEL WITH GFR     Other   Insomnia    He has a pretty good sleep hygiene routine.  Encouraged her to continue with that and we discussed options for medication treatment.  We will start with trazodone discussed how the medication works and what symptoms to monitor for.  Start with half a tab and okay to very gradually increase if needed.      Relevant Medications   traZODone (DESYREL) 50 MG tablet    Return in about 3 weeks (around 05/29/2022) for Nurse visit for BP check and have lab done. 07/29/2022, MD

## 2022-05-08 NOTE — Assessment & Plan Note (Signed)
Discontinue HCTZ and switch to losartan HCT.  Follow-up in 2 to 3 weeks for nurse visit.  We will need a BMP at that time.  Lab ordered today.  We can make adjustments to her regimen at that time.

## 2022-05-09 LAB — BASIC METABOLIC PANEL WITH GFR
BUN: 13 mg/dL (ref 7–25)
CO2: 28 mmol/L (ref 20–32)
Calcium: 10.2 mg/dL (ref 8.6–10.4)
Chloride: 104 mmol/L (ref 98–110)
Creat: 0.75 mg/dL (ref 0.50–1.05)
Glucose, Bld: 94 mg/dL (ref 65–99)
Potassium: 3.4 mmol/L — ABNORMAL LOW (ref 3.5–5.3)
Sodium: 142 mmol/L (ref 135–146)
eGFR: 91 mL/min/{1.73_m2} (ref >=60)

## 2022-05-11 NOTE — Progress Notes (Signed)
Marvene, potassium just a little borderline only off by 1/10 of a point.  Just work on increasing potassium in your diet.  There is some information online about potassium rich foods.  And we will keep an eye on it and plan to recheck again in about 3 months.

## 2022-05-27 ENCOUNTER — Ambulatory Visit: Payer: BC Managed Care – PPO

## 2022-06-05 ENCOUNTER — Other Ambulatory Visit: Payer: Self-pay

## 2022-06-05 ENCOUNTER — Ambulatory Visit (INDEPENDENT_AMBULATORY_CARE_PROVIDER_SITE_OTHER): Payer: BC Managed Care – PPO | Admitting: Family Medicine

## 2022-06-05 VITALS — BP 109/68 | HR 74 | Ht 61.0 in | Wt 131.0 lb

## 2022-06-05 DIAGNOSIS — I10 Essential (primary) hypertension: Secondary | ICD-10-CM | POA: Diagnosis not present

## 2022-06-05 NOTE — Progress Notes (Signed)
Agree with documentation as above.   Damiel Barthold, MD  

## 2022-06-05 NOTE — Progress Notes (Signed)
Patient is here for blood pressure check.   Previous BP was 177/85  1st BP today: 109/68  Denies chest pain, dizziness, shortness of breath, severe headache, or nosebleeds.  Taking medication as prescribed. Denies missed doses.  Medication dosage continuation cleared with Dr. Karie Schwalbe.

## 2022-06-05 NOTE — Telephone Encounter (Signed)
Codes added. It will take up to 6 weeks to process through the insurance.

## 2022-06-05 NOTE — Progress Notes (Unsigned)
Ordered labs per office note.  

## 2022-06-06 LAB — BASIC METABOLIC PANEL WITH GFR
BUN: 14 mg/dL (ref 7–25)
CO2: 27 mmol/L (ref 20–32)
Calcium: 10.4 mg/dL (ref 8.6–10.4)
Chloride: 104 mmol/L (ref 98–110)
Creat: 0.66 mg/dL (ref 0.50–1.05)
Glucose, Bld: 111 mg/dL — ABNORMAL HIGH (ref 65–99)
Potassium: 3.4 mmol/L — ABNORMAL LOW (ref 3.5–5.3)
Sodium: 140 mmol/L (ref 135–146)
eGFR: 100 mL/min/{1.73_m2} (ref >=60)

## 2022-06-08 NOTE — Progress Notes (Signed)
Kelli Frank, potassium still just a little borderline just encourage you to continue to work on increasing potassium intake with your food choices.  There is some good information online for potassium rich foods or we can send you a handout if you would like.

## 2022-09-28 ENCOUNTER — Other Ambulatory Visit: Payer: Self-pay | Admitting: *Deleted

## 2022-09-28 DIAGNOSIS — F5101 Primary insomnia: Secondary | ICD-10-CM

## 2022-09-28 MED ORDER — TRAZODONE HCL 50 MG PO TABS: 25.0000 mg | ORAL_TABLET | Freq: Every evening | ORAL | 3 refills | Status: DC | PRN

## 2022-10-20 ENCOUNTER — Ambulatory Visit: Payer: BC Managed Care – PPO | Admitting: Family Medicine

## 2022-10-20 ENCOUNTER — Encounter: Payer: Self-pay | Admitting: Family Medicine

## 2022-10-20 VITALS — BP 138/60 | HR 67

## 2022-10-20 DIAGNOSIS — F17210 Nicotine dependence, cigarettes, uncomplicated: Secondary | ICD-10-CM

## 2022-10-20 DIAGNOSIS — F5101 Primary insomnia: Secondary | ICD-10-CM

## 2022-10-20 DIAGNOSIS — I10 Essential (primary) hypertension: Secondary | ICD-10-CM

## 2022-10-20 DIAGNOSIS — F17200 Nicotine dependence, unspecified, uncomplicated: Secondary | ICD-10-CM

## 2022-10-20 DIAGNOSIS — Z720 Tobacco use: Secondary | ICD-10-CM | POA: Diagnosis not present

## 2022-10-20 MED ORDER — VALSARTAN-HYDROCHLOROTHIAZIDE 160-25 MG PO TABS: 1.0000 | ORAL_TABLET | Freq: Every day | ORAL | 3 refills | Status: DC

## 2022-10-20 MED ORDER — VALSARTAN-HYDROCHLOROTHIAZIDE 320-25 MG PO TABS: 1.0000 | ORAL_TABLET | Freq: Every day | ORAL | 3 refills | Status: DC

## 2022-10-20 MED ORDER — VARENICLINE TARTRATE 1 MG PO TABS: 1.0000 mg | ORAL_TABLET | Freq: Two times a day (BID) | ORAL | 0 refills | Status: DC

## 2022-10-20 MED ORDER — VARENICLINE TARTRATE 1 MG PO TABS: ORAL_TABLET | ORAL | 0 refills | Status: AC

## 2022-10-20 NOTE — Assessment & Plan Note (Signed)
Well with trazodone.  Continue current regimen.  Follow-up as needed.

## 2022-10-20 NOTE — Assessment & Plan Note (Addendum)
.    Blood pressure not quite at goal.  Will increase Diovan to 320/25.  New prescription sent to pharmacy.  Been 6 months.

## 2022-10-20 NOTE — Assessment & Plan Note (Signed)
Will try Chantix again.  We did discuss that there are good studies showing extending past 3 months can be helpful if desired.  For now we will send over prescription for the first 90 days.

## 2022-10-20 NOTE — Progress Notes (Signed)
   Established Patient Office Visit  Subjective   Patient ID: Kelli Frank, female    DOB: 27-May-1961  Age: 62 y.o. MRN: [id number]  Chief Complaint  Patient presents with   Follow-up         HPI  Hypertension- Pt denies chest pain, SOB, dizziness, or heart palpitations.  Taking meds as directed w/o problems.  Denies medication side effects.  She does occasionally go to the fire station to get her blood pressure checked and usually runs in the 140s when she is checked it there.  F/U insomnia -feels like the trazodone is been really helpful for sleeping.  She ended up sticking with 1-1/2 tabs and she usually takes it about 30 to 45 minutes before bedtime and that seems to be working well without any side effects.  She is interested in quitting smoking and using Chantix again she has used it before but always gets to the third month and then struggles.  Tried patches gum etc. in the past.  Experienced any nausea with the medication.  She is smoking about 1/4 pack/day per her currently.     ROS    Objective:     BP 138/60   Pulse 67   LMP 06/01/2012   SpO2 98%    Physical Exam Vitals and nursing note reviewed.  Constitutional:      Appearance: She is well-developed.  HENT:     Head: Normocephalic and atraumatic.  Cardiovascular:     Rate and Rhythm: Normal rate and regular rhythm.     Heart sounds: Normal heart sounds.  Pulmonary:     Effort: Pulmonary effort is normal.     Breath sounds: Normal breath sounds.  Skin:    General: Skin is warm and dry.  Neurological:     Mental Status: She is alert and oriented to person, place, and time.  Psychiatric:        Behavior: Behavior normal.     No results found for any visits on 10/20/22.    The 10-year ASCVD risk score (Arnett DK, et al., 2019) is: 14.8%    Assessment & Plan:   Problem List Items Addressed This Visit       Cardiovascular and Mediastinum   Primary hypertension - Primary    .  Blood pressure  not quite at goal.  Will increase Diovan to 320/25.  New prescription sent to pharmacy.  Been 6 months.      Relevant Medications   valsartan-hydrochlorothiazide (DIOVAN-HCT) 320-25 MG tablet     Other   Tobacco dependence    Will try Chantix again.  We did discuss that there are good studies showing extending past 3 months can be helpful if desired.  For now we will send over prescription for the first 90 days.      Relevant Medications   varenicline (CHANTIX) 1 MG tablet   varenicline (CHANTIX) 1 MG tablet (Start on 11/18/2022)   Insomnia    Well with trazodone.  Continue current regimen.  Follow-up as needed.      Other Visit Diagnoses     Tobacco abuse       Relevant Medications   varenicline (CHANTIX) 1 MG tablet       Return in about 6 months (around 04/20/2023) for Hypertension.    Beatrice Lecher, MD

## 2022-10-24 ENCOUNTER — Encounter: Payer: Self-pay | Admitting: Family Medicine

## 2022-10-26 NOTE — Telephone Encounter (Signed)
Pt called and LVM about her bp medication she stated that Levada Dy did reply to her question she stated that she wasn't able to get into her mychart to respond and called and LVM asking for a return call to verify this.   I called pt back and LVM advising her that the information that Levada Dy gave her was correct and that she was to d/c the Valsartan-hctz 160-25 mg and start taking the Valsartan-hctz 360-25 mg from now on since her bp wasn't at goal. Advised her to call back should she have any more questions.

## 2023-01-12 ENCOUNTER — Other Ambulatory Visit: Payer: Self-pay | Admitting: *Deleted

## 2023-01-12 DIAGNOSIS — F5101 Primary insomnia: Secondary | ICD-10-CM

## 2023-01-12 DIAGNOSIS — Z720 Tobacco use: Secondary | ICD-10-CM

## 2023-01-12 DIAGNOSIS — F17200 Nicotine dependence, unspecified, uncomplicated: Secondary | ICD-10-CM

## 2023-01-12 MED ORDER — TRAZODONE HCL 50 MG PO TABS: 25.0000 mg | ORAL_TABLET | Freq: Every evening | ORAL | 3 refills | Status: DC | PRN

## 2023-01-12 MED ORDER — VARENICLINE TARTRATE 1 MG PO TABS: 1.0000 mg | ORAL_TABLET | Freq: Two times a day (BID) | ORAL | 0 refills | Status: DC

## 2023-01-15 ENCOUNTER — Other Ambulatory Visit: Payer: Self-pay | Admitting: Family Medicine

## 2023-01-15 DIAGNOSIS — F5101 Primary insomnia: Secondary | ICD-10-CM

## 2023-04-04 ENCOUNTER — Other Ambulatory Visit: Payer: Self-pay | Admitting: Family Medicine

## 2023-04-04 DIAGNOSIS — F5101 Primary insomnia: Secondary | ICD-10-CM

## 2023-04-07 ENCOUNTER — Encounter: Payer: Self-pay | Admitting: Family Medicine

## 2023-04-07 ENCOUNTER — Ambulatory Visit: Payer: BC Managed Care – PPO | Admitting: Family Medicine

## 2023-04-07 VITALS — BP 122/66 | HR 86 | Ht 61.0 in | Wt 140.0 lb

## 2023-04-07 DIAGNOSIS — I10 Essential (primary) hypertension: Secondary | ICD-10-CM

## 2023-04-07 DIAGNOSIS — R42 Dizziness and giddiness: Secondary | ICD-10-CM | POA: Diagnosis not present

## 2023-04-07 MED ORDER — VALSARTAN-HYDROCHLOROTHIAZIDE 160-12.5 MG PO TABS: 1.0000 | ORAL_TABLET | Freq: Every day | ORAL | 3 refills | Status: DC

## 2023-04-07 NOTE — Progress Notes (Signed)
Established Patient Office Visit  Subjective   Patient ID: Kelli Frank, female    DOB: [birth year]  Age: 62 y.o. MRN: [id number]  Chief Complaint  Patient presents with   Hypertension    Pt reports that    HPI  On June 5th she upped her stress supplement supplement and then started feeling dizzy and later in the day felt sick and vertigo.  Had not energy.  Her pulse was low. Then 3-4 weeks later had severe vertigo. So then dropped her BP pill back down to the 160mg  and felt some btter. Then retried the higher dose and the vertigo came back.  She is back on the 160mg  now and still getting some occasional mild brief symptoms but not as much as when she was taking the full strength dose.. Vertigo last for usually less than a minute.   She was describes it as feeling like she is moving a little bit and was like walking on a boat. Doesn't feel ike she is going to pass out.   No recent cold symptoms.  Did quit smoking in February and wonders if maybe her blood pressure has come down since she is quit smoking.  She also joined the gym last week and is planning on starting to work out 4 days a week.  She is going to  start with a Systems analyst.    ROS    Objective:     BP 122/66   Pulse 86   Ht 5\' 1"  (1.549 m)   Wt 140 lb (63.5 kg)   LMP 06/01/2012   SpO2 97%   BMI 26.45 kg/m    Physical Exam Constitutional:      Appearance: She is well-developed.  HENT:     Head: Normocephalic and atraumatic.     Right Ear: Ear canal and external ear normal.     Left Ear: Tympanic membrane, ear canal and external ear normal.     Ears:     Comments: Still little bit of fluid behind the right TM.    Nose: Nose normal.     Mouth/Throat:     Pharynx: Oropharynx is clear.  Eyes:     Conjunctiva/sclera: Conjunctivae normal.     Pupils: Pupils are equal, round, and reactive to light.  Neck:     Thyroid: No thyromegaly.  Cardiovascular:     Rate and Rhythm: Normal rate and regular rhythm.      Heart sounds: Normal heart sounds.  Pulmonary:     Effort: Pulmonary effort is normal.     Breath sounds: Normal breath sounds. No wheezing.  Musculoskeletal:     Cervical back: Neck supple.  Lymphadenopathy:     Cervical: No cervical adenopathy.  Skin:    General: Skin is warm and dry.  Neurological:     Mental Status: She is alert and oriented to person, place, and time.     Comments: Negative Dix-Hallpike maneuver.      No results found for any visits on 04/07/23.    The 10-year ASCVD risk score (Arnett DK, et al., 2019) is: 12.3%    Assessment & Plan:   Problem List Items Addressed This Visit       Cardiovascular and Mediastinum   Primary hypertension - Primary   Relevant Medications   valsartan-hydrochlorothiazide (DIOVAN-HCT) 160-12.5 MG tablet   Other Visit Diagnoses     Vertigo          He is describing true vertigo but has a negative  Dix-Hallpike maneuver this morning.  She actually did get the sensation while she was sitting and talking with me it just lasted a few seconds and then went away.  Blood pressure looks absolutely fantastic.  No orthostasis.  Since her blood pressures have looked really good I am actually to try to decrease the hydrochlorothiazide component of her blood pressure pill just to see if she feels better.  Will just have to make sure that the systolic pressure is staying under 130 pretty consistently.  She can call if she develops new or worsening symptoms or if it is just not improving over the next couple of weeks.  Super super excited that she is quit smoking which is absolutely fantastic.  She has successfully quit over the last 5 months.  I spent 30 minutes on the day of the encounter to include pre-visit record review, face-to-face time with the patient and post visit ordering of test.   Return in about 3 months (around 07/08/2023) for Hypertension.    Nani Gasser, MD

## 2023-07-09 ENCOUNTER — Encounter: Payer: Self-pay | Admitting: Family Medicine

## 2023-07-09 ENCOUNTER — Ambulatory Visit: Payer: BC Managed Care – PPO | Admitting: Family Medicine

## 2023-07-09 VITALS — BP 124/56 | HR 80 | Ht 61.0 in | Wt 146.0 lb

## 2023-07-09 DIAGNOSIS — I10 Essential (primary) hypertension: Secondary | ICD-10-CM

## 2023-07-09 DIAGNOSIS — R635 Abnormal weight gain: Secondary | ICD-10-CM

## 2023-07-09 DIAGNOSIS — R7301 Impaired fasting glucose: Secondary | ICD-10-CM

## 2023-07-09 DIAGNOSIS — F5101 Primary insomnia: Secondary | ICD-10-CM | POA: Diagnosis not present

## 2023-07-09 DIAGNOSIS — E782 Mixed hyperlipidemia: Secondary | ICD-10-CM

## 2023-07-09 MED ORDER — TRAZODONE HCL 50 MG PO TABS: 25.0000 mg | ORAL_TABLET | Freq: Every evening | ORAL | 3 refills | Status: DC | PRN

## 2023-07-09 NOTE — Assessment & Plan Note (Signed)
Doing really well with the trazodone.  She says it has been extremely helpful for her sleep.  Would like a refill sent today.

## 2023-07-09 NOTE — Progress Notes (Signed)
   Established Patient Office Visit  Subjective   Patient ID: Kelli Frank, female    DOB: 05/15/1961  Age: 62 y.o. MRN: [id number]  Chief Complaint  Patient presents with   Hypertension   ifg    HPI  Impaired fasting glucose-no increased thirst or urination. No symptoms consistent with hypoglycemia.  Hypertension- Pt denies chest pain, SOB, dizziness, or heart palpitations.  Taking meds as directed w/o problems.  Denies medication side effects.       ROS    Objective:     BP (!) 124/56   Pulse 80   Ht 5\' 1"  (1.549 m)   Wt 146 lb (66.2 kg)   LMP 06/01/2012   SpO2 100%   BMI 27.59 kg/m     Physical Exam Vitals and nursing note reviewed.  Constitutional:      Appearance: Normal appearance.  HENT:     Head: Normocephalic and atraumatic.  Eyes:     Conjunctiva/sclera: Conjunctivae normal.  Cardiovascular:     Rate and Rhythm: Normal rate and regular rhythm.  Pulmonary:     Effort: Pulmonary effort is normal.     Breath sounds: Normal breath sounds.  Skin:    General: Skin is warm and dry.  Neurological:     Mental Status: She is alert.  Psychiatric:        Mood and Affect: Mood normal.     No results found for any visits on 07/09/23.     The 10-year ASCVD risk score (Arnett DK, et al., 2019) is: 12.7%    Assessment & Plan:   Problem List Items Addressed This Visit       Cardiovascular and Mediastinum   Primary hypertension - Primary    Well controlled. Continue current regimen. Follow up in  66mo       Relevant Orders   CMP14+EGFR   Lipid panel   CBC   Hemoglobin A1c   TSH     Endocrine   IFG (impaired fasting glucose)    Due to recheck A1C       Relevant Orders   CMP14+EGFR   Lipid panel   CBC   Hemoglobin A1c   TSH     Other   Insomnia    Doing really well with the trazodone.  She says it has been extremely helpful for her sleep.  Would like a refill sent today.      Relevant Medications   traZODone (DESYREL) 50 MG tablet    Hyperlipidemia    Due to recheck lipids.        Relevant Orders   CMP14+EGFR   Lipid panel   CBC   Hemoglobin A1c   TSH   Other Visit Diagnoses     Abnormal weight gain       Relevant Orders   CMP14+EGFR   Lipid panel   CBC   Hemoglobin A1c   TSH      Abnormal weight gain -  she has actually gone up in weight since she was last here about 6 pounds but she has been exercising working out.  We did discuss some strategies around looking at her caloric intake and using an app such as my fitness pal or lose it to help her set some calorie goals.  Return in about 6 months (around 01/07/2024) for Hypertension.    Nani Gasser, MD

## 2023-07-09 NOTE — Assessment & Plan Note (Signed)
Due to recheck lipids. 

## 2023-07-09 NOTE — Assessment & Plan Note (Signed)
Due to recheck A1C  

## 2023-07-09 NOTE — Assessment & Plan Note (Signed)
Well controlled. Continue current regimen. Follow up in  6 mo  

## 2023-07-10 LAB — CBC
Hematocrit: 37 % (ref 34.0–46.6)
Hemoglobin: 12.1 g/dL (ref 11.1–15.9)
MCH: 30.1 pg (ref 26.6–33.0)
MCHC: 32.7 g/dL (ref 31.5–35.7)
MCV: 92 fL (ref 79–97)
Platelets: 235 10*3/uL (ref 150–450)
RBC: 4.02 x10E6/uL (ref 3.77–5.28)
RDW: 12.5 % (ref 11.7–15.4)
WBC: 4.7 10*3/uL (ref 3.4–10.8)

## 2023-07-10 LAB — LIPID PANEL
Chol/HDL Ratio: 4.3 {ratio} (ref 0.0–4.4)
Cholesterol, Total: 247 mg/dL — ABNORMAL HIGH (ref 100–199)
HDL: 58 mg/dL (ref >39)
LDL Chol Calc (NIH): 171 mg/dL — ABNORMAL HIGH (ref 0–99)
Triglycerides: 102 mg/dL (ref 0–149)
VLDL Cholesterol Cal: 18 mg/dL (ref 5–40)

## 2023-07-10 LAB — CMP14+EGFR
ALT: 29 [IU]/L (ref 0–32)
AST: 26 [IU]/L (ref 0–40)
Albumin: 4.1 g/dL (ref 3.9–4.9)
Alkaline Phosphatase: 107 [IU]/L (ref 44–121)
BUN/Creatinine Ratio: 17 (ref 12–28)
BUN: 15 mg/dL (ref 8–27)
Bilirubin Total: <0.2 mg/dL (ref 0.0–1.2)
CO2: 24 mmol/L (ref 20–29)
Calcium: 8.9 mg/dL (ref 8.7–10.3)
Chloride: 102 mmol/L (ref 96–106)
Creatinine, Ser: 0.88 mg/dL (ref 0.57–1.00)
Globulin, Total: 2.2 g/dL (ref 1.5–4.5)
Glucose: 95 mg/dL (ref 70–99)
Potassium: 3.3 mmol/L — ABNORMAL LOW (ref 3.5–5.2)
Sodium: 141 mmol/L (ref 134–144)
Total Protein: 6.3 g/dL (ref 6.0–8.5)
eGFR: 75 mL/min/{1.73_m2} (ref >59)

## 2023-07-10 LAB — HEMOGLOBIN A1C
Est. average glucose Bld gHb Est-mCnc: 140 mg/dL
Hgb A1c MFr Bld: 6.5 % — ABNORMAL HIGH (ref 4.8–5.6)

## 2023-07-10 LAB — TSH: TSH: 1.54 u[IU]/mL (ref 0.450–4.500)

## 2023-07-12 NOTE — Progress Notes (Signed)
Hi Aphrodite, potassium was still low at 3.3.  I would recommend that we discontinue the HCTZ component of your blood pressure pill and just use the valsartan by itself.  If you are okay with that please let me know and I can send over a new prescription to your pharmacy.  You think it is the HCTZ component that is causing your potassium to stay low.  Your total cholesterol and LDL are very elevated.  This puts your 10-year risk for heart attack or stroke at 10% which is high enough that you really need to be taking a statin daily.  You are okay with starting 1 then please let me know.  Also your A1c jumped up to 6.5 which is officially in the diabetes range.  Really want you to cut back on sweets and carbs and work on increasing exercise/activity level and plan to recheck again in the office at 3 months.  Blood Count looks great.  Thyroid is normal.  The 10-year ASCVD risk score (Arnett DK, et al., 2019) is: 10.5%   Values used to calculate the score:     Age: 62 years     Sex: Female     Is Non-Hispanic African American: No     Diabetic: No     Tobacco smoker: Yes     Systolic Blood Pressure: 124 mmHg     Is BP treated: Yes     HDL Cholesterol: 58 mg/dL     Total Cholesterol: 247 mg/dL

## 2023-07-13 ENCOUNTER — Other Ambulatory Visit: Payer: Self-pay | Admitting: Family Medicine

## 2023-07-13 DIAGNOSIS — I10 Essential (primary) hypertension: Secondary | ICD-10-CM

## 2023-07-13 MED ORDER — VALSARTAN 320 MG PO TABS
320.0000 mg | ORAL_TABLET | Freq: Every day | ORAL | 1 refills | Status: DC
Start: 2023-07-13 — End: 2024-01-19

## 2023-07-13 NOTE — Progress Notes (Signed)
Meds ordered this encounter  Medications   valsartan (DIOVAN) 320 MG tablet    Sig: Take 1 tablet (320 mg total) by mouth daily.    Dispense:  90 tablet    Refill:  1

## 2023-07-13 NOTE — Progress Notes (Signed)
Med sent.

## 2023-10-06 ENCOUNTER — Ambulatory Visit (INDEPENDENT_AMBULATORY_CARE_PROVIDER_SITE_OTHER): Payer: Self-pay | Admitting: Family Medicine

## 2023-10-06 ENCOUNTER — Encounter: Payer: Self-pay | Admitting: Family Medicine

## 2023-10-06 VITALS — BP 174/79 | HR 63 | Ht 61.0 in | Wt 148.2 lb

## 2023-10-06 DIAGNOSIS — J019 Acute sinusitis, unspecified: Secondary | ICD-10-CM | POA: Diagnosis not present

## 2023-10-06 DIAGNOSIS — B9689 Other specified bacterial agents as the cause of diseases classified elsewhere: Secondary | ICD-10-CM | POA: Insufficient documentation

## 2023-10-06 DIAGNOSIS — R030 Elevated blood-pressure reading, without diagnosis of hypertension: Secondary | ICD-10-CM | POA: Insufficient documentation

## 2023-10-06 MED ORDER — DOXYCYCLINE HYCLATE 100 MG PO TABS: 100.0000 mg | ORAL_TABLET | Freq: Two times a day (BID) | ORAL | 0 refills | Status: AC

## 2023-10-06 NOTE — Assessment & Plan Note (Signed)
 Likely secondary to pain and not feeling well today.  Bring pt in for two week nurse visit follow up for BP check

## 2023-10-06 NOTE — Assessment & Plan Note (Signed)
 Based on duration of symptoms and history do believe pt needs abx. Have sent in doxycycline . Recommended increasing vit c, d, and zinc.  - have also sent referral into ENT since pt says this has been ongoing for a few months right now.

## 2023-10-06 NOTE — Progress Notes (Signed)
   Acute Office Visit  Subjective:     Patient ID: Kelli Frank, female    DOB: 14-Sep-1961, 63 y.o.   MRN: [id number]  Chief Complaint  Patient presents with   Nasal Congestion    Pt states she has had nasal congestion, burning eyes, and sneezing x49months    HPI Patient is in today for acute visit. Pt notes sinus pain and pressure along with congestion since November. Says it will go away for and come right back.   Review of Systems  Constitutional:  Negative for chills and fever.  HENT:  Positive for congestion and sinus pain.   Respiratory:  Negative for cough and shortness of breath.   Cardiovascular:  Negative for chest pain.  Neurological:  Negative for headaches.        Objective:    BP (!) 174/79 (BP Location: Left Arm, Patient Position: Sitting, Cuff Size: Normal)   Pulse 63   Ht 5\' 1"  (1.549 m)   Wt 148 lb 4 oz (67.2 kg)   LMP 06/01/2012   SpO2 100%   BMI 28.01 kg/m    Physical Exam Vitals and nursing note reviewed.  Constitutional:      General: She is not in acute distress.    Appearance: Normal appearance.  HENT:     Head: Normocephalic and atraumatic.     Right Ear: External ear normal.     Left Ear: External ear normal.     Nose: Nose normal.  Eyes:     Conjunctiva/sclera: Conjunctivae normal.  Cardiovascular:     Rate and Rhythm: Normal rate and regular rhythm.  Pulmonary:     Effort: Pulmonary effort is normal.     Breath sounds: Normal breath sounds.  Neurological:     General: No focal deficit present.     Mental Status: She is alert and oriented to person, place, and time.  Psychiatric:        Mood and Affect: Mood normal.        Behavior: Behavior normal.        Thought Content: Thought content normal.        Judgment: Judgment normal.     No results found for any visits on 10/06/23.      Assessment & Plan:   Problem List Items Addressed This Visit       Respiratory   Acute bacterial sinusitis - Primary   Based on  duration of symptoms and history do believe pt needs abx. Have sent in doxycycline . Recommended increasing vit c, d, and zinc.  - have also sent referral into ENT since pt says this has been ongoing for a few months right now.      Relevant Medications   doxycycline  (VIBRA -TABS) 100 MG tablet   Other Relevant Orders   Ambulatory referral to ENT     Other   Elevated BP without diagnosis of hypertension   Likely secondary to pain and not feeling well today.  Bring pt in for two week nurse visit follow up for BP check       Meds ordered this encounter  Medications   doxycycline  (VIBRA -TABS) 100 MG tablet    Sig: Take 1 tablet (100 mg total) by mouth 2 (two) times daily for 7 days.    Dispense:  14 tablet    Refill:  0    No follow-ups on file.  Josepha Nickels, DO

## 2023-10-08 ENCOUNTER — Encounter (INDEPENDENT_AMBULATORY_CARE_PROVIDER_SITE_OTHER): Payer: Self-pay | Admitting: Otolaryngology

## 2023-10-18 ENCOUNTER — Telehealth: Payer: Self-pay | Admitting: Family Medicine

## 2023-10-18 DIAGNOSIS — F5101 Primary insomnia: Secondary | ICD-10-CM

## 2023-10-18 MED ORDER — TRAZODONE HCL 50 MG PO TABS: 25.0000 mg | ORAL_TABLET | Freq: Every evening | ORAL | 3 refills | Status: DC | PRN

## 2023-10-18 NOTE — Telephone Encounter (Signed)
Meds ordered this encounter  Medications   traZODone (DESYREL) 50 MG tablet    Sig: Take 0.5-2 tablets (25-100 mg total) by mouth at bedtime as needed for sleep.    Dispense:  45 tablet    Refill:  3    ZERO refills remain on this prescription. Your patient is requesting advance approval of refills for this medication to PREVENT ANY MISSED DOSES

## 2023-10-25 ENCOUNTER — Ambulatory Visit: Payer: 59 | Admitting: Family Medicine

## 2023-10-25 ENCOUNTER — Encounter: Payer: Self-pay | Admitting: Family Medicine

## 2023-10-25 VITALS — BP 134/66 | HR 81 | Ht 61.0 in | Wt 149.0 lb

## 2023-10-25 DIAGNOSIS — Z87891 Personal history of nicotine dependence: Secondary | ICD-10-CM

## 2023-10-25 DIAGNOSIS — J019 Acute sinusitis, unspecified: Secondary | ICD-10-CM | POA: Diagnosis not present

## 2023-10-25 DIAGNOSIS — R0602 Shortness of breath: Secondary | ICD-10-CM | POA: Diagnosis not present

## 2023-10-25 MED ORDER — DOXYCYCLINE HYCLATE 100 MG PO TABS: 100.0000 mg | ORAL_TABLET | Freq: Two times a day (BID) | ORAL | 0 refills | Status: DC

## 2023-10-25 NOTE — Progress Notes (Signed)
Acute Office Visit  Subjective:     Patient ID: Kelli Frank, female    DOB: 04/03/1961, 63 y.o.   MRN: [id number]  Chief Complaint  Patient presents with   Follow-up         HPI Patient is in today for persistent sinus sxs on and off since a URI in Oct.    Had 7 days of doxy about 2 weeks ago and felt some better by Day 6 but then started to feel bad again a few days after completed the ABX.    She also report feeling like it takes longer to breath out when she exercises. She quit smoking a year agol   ROS      Objective:    BP 134/66   Pulse 81   Ht 5\' 1"  (1.549 m)   Wt 149 lb (67.6 kg)   LMP 06/01/2012   SpO2 100%   BMI 28.15 kg/m     Physical Exam Constitutional:      Appearance: Normal appearance.  HENT:     Head: Normocephalic and atraumatic.     Right Ear: Tympanic membrane, ear canal and external ear normal. There is no impacted cerumen.     Left Ear: Tympanic membrane, ear canal and external ear normal. There is no impacted cerumen.     Nose: Nose normal.     Mouth/Throat:     Pharynx: Oropharynx is clear.  Eyes:     Conjunctiva/sclera: Conjunctivae normal.  Cardiovascular:     Rate and Rhythm: Normal rate and regular rhythm.  Pulmonary:     Effort: Pulmonary effort is normal.     Breath sounds: Normal breath sounds.  Musculoskeletal:     Cervical back: Neck supple. No tenderness.  Lymphadenopathy:     Cervical: No cervical adenopathy.  Skin:    General: Skin is warm and dry.  Neurological:     Mental Status: She is alert and oriented to person, place, and time.  Psychiatric:        Mood and Affect: Mood normal.     No results found for any visits on 10/25/23.      Assessment & Plan:   Problem List Items Addressed This Visit   None Visit Diagnoses       Acute non-recurrent sinusitis, unspecified location    -  Primary   Relevant Medications   doxycycline (VIBRA-TABS) 100 MG tablet     Former smoker         SOB (shortness of  breath)           Acute sinusitis -we will go ahead and treat with 10 days of Doxy she does have a penicillin allergy if she is not better at that time consider sinus CT for further workup.  Former smoker noticing that it is taking longer to expel her breaths.  Could indicate obstructive lung disease.  Like to have her follow-up at the end of the month when she is feeling better for spirometry for further workup.  She did quit smoking a year ago which is fantastic.  She is also concerned that she has gained a little bit of weight but she is also been working out at [employer] and has gained some muscle mass.  Meds ordered this encounter  Medications   doxycycline (VIBRA-TABS) 100 MG tablet    Sig: Take 1 tablet (100 mg total) by mouth 2 (two) times daily.    Dispense:  20 tablet    Refill:  0    Return in about 3 weeks (around 11/15/2023) for spirometry .  Nani Gasser, MD

## 2023-11-22 ENCOUNTER — Other Ambulatory Visit: Payer: 59

## 2023-11-25 ENCOUNTER — Other Ambulatory Visit

## 2023-11-26 ENCOUNTER — Ambulatory Visit
Admission: RE | Admit: 2023-11-26 | Discharge: 2023-11-26 | Disposition: A | Discharge status: Home / Self Care | Payer: Self-pay | Source: Ambulatory Visit | Attending: Family Medicine | Admitting: Family Medicine

## 2023-11-26 VITALS — BP 148/92 | HR 71 | Temp 98.2°F | Resp 16

## 2023-11-26 DIAGNOSIS — T7840XA Allergy, unspecified, initial encounter: Secondary | ICD-10-CM

## 2023-11-26 MED ORDER — PREDNISONE 10 MG (21) PO TBPK: ORAL_TABLET | Freq: Every day | ORAL | 0 refills | Status: DC

## 2023-11-26 NOTE — ED Provider Notes (Signed)
 [facility]    CSN: [id number] Arrival date & time: 11/26/23  0800      History   Chief Complaint Chief Complaint  Patient presents with   Allergic Reaction    Mold exposure - Entered by patient    HPI Kelli Frank is a 63 y.o. female.   HPI 63 year old female presents to urgent care with complaint of congestion and upper respiratory symptoms.  Reports that her apartment is positive for mold and she is attempting to move but needs relief for breathing.  Patient was treated for acute nonrecurrent sinusitis on 10/25/2023-please see epic for that encounter note.  PMH is significant for insomnia, primary HTN, and HLD.  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 Authorizing Provider  predniSONE (STERAPRED UNI-PAK 21 TAB) 10 MG (21) TBPK tablet Take by  mouth daily. Take 6 tabs by mouth daily  for 2 days, then 5 tabs for 2 days, then 4 tabs for 2 days, then 3 tabs for 2 days, 2 tabs for 2 days, then 1 tab by mouth daily for 2 days 11/26/23  Yes Trevor Iha, FNP  AMBULATORY NON FORMULARY MEDICATION Medication Name: Juice plus    [provider]  traZODone (DESYREL) 50 MG tablet Take 0.5-2 tablets (25-100 mg total) by mouth at bedtime as needed for sleep. 10/18/23   Agapito Games, MD  valsartan (DIOVAN) 320 MG tablet Take 1 tablet (320 mg total) by mouth daily. 07/13/23   Agapito Games, MD    Family History Family History  Problem Relation Age of Onset   Heart attack Father    Hypertension Father    Hyperlipidemia Father    Hyperlipidemia Brother    Thyroid disease Brother    Cancer Other        grandfather/colorectal    Social History Social History   Tobacco Use   Smoking status: Former    Types: Cigarettes    Start date: 11/13/2021   Smokeless tobacco: Never   Tobacco comments:  hx of 25 packyears, now smoking 5-6 cig/day  Substance Use Topics   Alcohol use: Yes    Comment: occasional   Drug use: No     Allergies   Penicillins, Atorvastatin, Hydrochlorothiazide, and Tetanus-diphtheria toxoids td   Review of Systems Review of Systems  Respiratory:  Positive for shortness of breath.        Mild shortness of breath per patient's description this morning and is concerned that mold in her apartment is causing this type of breathing     Physical Exam Triage Vital Signs ED Triage Vitals [11/26/23 0824]  Encounter Vitals Group     BP      Systolic BP Percentile      Diastolic BP Percentile      Pulse      Resp      Temp      Temp src      SpO2      Weight      Height      Head Circumference      Peak Flow      Pain Score 0     Pain Loc      Pain Education      Exclude from Growth Chart    No data found.  Updated Vital Signs BP (!) 148/92   Pulse 71   Temp 98.2 F (36.8 C)    Resp 16   LMP 06/01/2012   SpO2 98%    Physical Exam Constitutional:      General: She is not in acute distress.    Appearance: Normal appearance. She is normal weight. She is not ill-appearing.  HENT:     Head: Normocephalic and atraumatic.     Right Ear: Tympanic membrane, ear canal and external ear normal.     Left Ear: Tympanic membrane, ear canal and external ear normal.     Mouth/Throat:     Mouth: Mucous membranes are moist.     Pharynx: Oropharynx is clear.  Eyes:     Extraocular Movements: Extraocular movements intact.     Conjunctiva/sclera: Conjunctivae normal.     Pupils: Pupils are equal, round, and reactive to light.  Cardiovascular:     Rate and Rhythm: Normal rate and regular rhythm.     Pulses: Normal pulses.     Heart sounds: Normal heart sounds.  Pulmonary:     Breath sounds: No wheezing, rhonchi or rales.  Musculoskeletal:        General: Normal range of motion.     Cervical back: Normal range of motion and neck supple.  Skin:    General: Skin is warm and dry.  Neurological:     General: No focal deficit present.     Mental Status: She is alert and oriented to person, place, and time. Mental status is at baseline.  Psychiatric:        Mood and Affect: Mood normal.        Behavior: Behavior normal.      UC Treatments / Results  Labs (all labs ordered are listed, but only abnormal results are displayed) Labs Reviewed - No data to display  EKG   Radiology No results found.  Procedures Procedures (including critical care time)  Medications Ordered in UC Medications - No data to display  Initial Impression / Assessment and Plan / UC Course  I have reviewed the triage vital signs and the nursing notes.  Pertinent labs & imaging results that were available during my care of  the patient were reviewed by me and considered in my medical decision making (see chart for details).     MDM: 1.  Allergic reaction, initial encounter-Rx'd Sterapred  Unipak 10 mg 21 tablet. Advised patient to take medication as directed with food to completion.  Encouraged to increase daily water intake to 64 ounces per day while taking this medication.  Advised if symptoms worsen and/or unresolved please follow-up with your PCP or here for further evaluation.  Patient discharged home, hemodynamically stable.  Final Clinical Impressions(s) / UC Diagnoses   Final diagnoses:  Allergic reaction, initial encounter     Discharge Instructions      Advised patient to take medication as directed with food to completion.  Encouraged to increase daily water intake to 64 ounces per day while taking this medication.  Advised if symptoms worsen and/or unresolved please follow-up with your PCP or here for further evaluation.     ED Prescriptions     Medication Sig Dispense Auth. Provider   predniSONE (STERAPRED UNI-PAK 21 TAB) 10 MG (21) TBPK tablet Take by mouth daily. Take 6 tabs by mouth daily  for 2 days, then 5 tabs for 2 days, then 4 tabs for 2 days, then 3 tabs for 2 days, 2 tabs for 2 days, then 1 tab by mouth daily for 2 days 42 tablet Trevor Iha, FNP      PDMP not reviewed this encounter.   Trevor Iha, FNP 11/26/23 [id number]

## 2023-11-26 NOTE — ED Triage Notes (Addendum)
 Pt presents to uc with co of congestion and uri since October / November symptoms and her apartment is positive for mold and she is attempting to move but she needs temporary relief for her breathing

## 2023-11-26 NOTE — Discharge Instructions (Addendum)
 Advised patient to take medication as directed with food to completion.  Encouraged to increase daily water intake to 64 ounces per day while taking this medication.  Advised if symptoms worsen and/or unresolved please follow-up with your PCP or here for further evaluation.

## 2023-12-07 ENCOUNTER — Ambulatory Visit (INDEPENDENT_AMBULATORY_CARE_PROVIDER_SITE_OTHER): Admitting: Family Medicine

## 2023-12-07 VITALS — BP 142/77 | HR 78 | Temp 97.6°F | Ht 61.0 in | Wt 150.7 lb

## 2023-12-07 DIAGNOSIS — R0602 Shortness of breath: Secondary | ICD-10-CM

## 2023-12-07 DIAGNOSIS — J449 Chronic obstructive pulmonary disease, unspecified: Secondary | ICD-10-CM | POA: Diagnosis not present

## 2023-12-07 MED ORDER — ALBUTEROL SULFATE HFA 108 (90 BASE) MCG/ACT IN AERS
2.0000 | INHALATION_SPRAY | Freq: Once | RESPIRATORY_TRACT | Status: AC
Start: 2023-12-07 — End: 2023-12-07
  Administered 2023-12-07: 2 via RESPIRATORY_TRACT

## 2023-12-07 NOTE — Progress Notes (Signed)
   Established Patient Office Visit  Subjective  Patient ID: Kelli Frank, female    DOB: [birth year]  Age: 63 y.o. MRN: [id number]  Chief Complaint  Patient presents with   Spirometry    HPI  Here for follow-up shortness of breath-when she was here in February for sinusitis she had noticed that it was taking longer to expel her breaths in general even before she was sick.  She is a former smoker.  No recent chest x-ray on file.  He recently found that there is mold growing in the events in her apartment.  Since the weather has been better the last couple of weeks she has been just trying to keep the windows open and has turned off the heat/air conditioning unit.  And she feels like her breathing is a little better maybe 15% improved.  She is looking for a place to move so that she can get out of that environment.  She just noticed a huge difference in her breathing and frequency of respiratory illnesses this past winter.    ROS    Objective:     BP (!) 142/77 (BP Location: Left Arm, Patient Position: Sitting, Cuff Size: Normal)   Pulse 78   Temp 97.6 F (36.4 C) (Oral)   Ht 5\' 1"  (1.549 m)   Wt 150 lb 11.2 oz (68.4 kg)   LMP 06/01/2012   SpO2 100%   BMI 28.47 kg/m    Physical Exam   No results found for any visits on 12/07/23.    The 10-year ASCVD risk score (Arnett DK, et al., 2019) is: 6.8%    Assessment & Plan:   Problem List Items Addressed This Visit       Respiratory   COPD, mild (HCC)   Spirometry March 2025 showing FVC of 85%, FEV1 77%, which the ratio of 70%.  Borderline for diagnosis of COPD.  Okay to use albuterol as needed for shortness of breath if sick okay to do a trial of pretreatment before exercise.  Also discussed making sure that she gets her flu shot yearly, avoid respiratory triggers.  Continue to exercise routinely to improve lung function.  And the best thing that she ever did was to quit smoking.      Other Visit Diagnoses       SOB  (shortness of breath)    -  Primary   Relevant Medications   albuterol (VENTOLIN HFA) 108 (90 Base) MCG/ACT inhaler 2 puff (Completed)   Other Relevant Orders   PR BRNCDILAT RSPSE SPMTRY PRE&POST-BRNCDILAT ADMN       I do think the mold exposure in her apartment is affecting her breathing and increasing her risk for respiratory illnesses and making it more difficult for her to improve with treatment.  I would highly recommend moving out of her current location especially if the apartment complex is unable to immediately remediate the mold issue.  Return if symptoms worsen or fail to improve.    Nani Gasser, MD

## 2023-12-07 NOTE — Assessment & Plan Note (Signed)
 Spirometry March 2025 showing FVC of 85%, FEV1 77%, which the ratio of 70%.  Borderline for diagnosis of COPD.  Okay to use albuterol as needed for shortness of breath if sick okay to do a trial of pretreatment before exercise.  Also discussed making sure that she gets her flu shot yearly, avoid respiratory triggers.  Continue to exercise routinely to improve lung function.  And the best thing that she ever did was to quit smoking.

## 2023-12-07 NOTE — Progress Notes (Signed)
 Patient presented today for a Spirometry testing during a NV for concerns of shortness of breath.

## 2023-12-10 ENCOUNTER — Telehealth (INDEPENDENT_AMBULATORY_CARE_PROVIDER_SITE_OTHER): Payer: Self-pay | Admitting: Otolaryngology

## 2023-12-10 NOTE — Telephone Encounter (Signed)
 Left vm to confirm appt date and location for 12/10/2023.

## 2023-12-13 ENCOUNTER — Ambulatory Visit (INDEPENDENT_AMBULATORY_CARE_PROVIDER_SITE_OTHER): Payer: 59 | Admitting: Otolaryngology

## 2023-12-13 ENCOUNTER — Encounter (INDEPENDENT_AMBULATORY_CARE_PROVIDER_SITE_OTHER): Payer: Self-pay

## 2023-12-13 VITALS — BP 157/79 | HR 83 | Ht 61.0 in | Wt 150.0 lb

## 2023-12-13 DIAGNOSIS — J342 Deviated nasal septum: Secondary | ICD-10-CM

## 2023-12-13 DIAGNOSIS — R229 Localized swelling, mass and lump, unspecified: Secondary | ICD-10-CM

## 2023-12-13 DIAGNOSIS — J3489 Other specified disorders of nose and nasal sinuses: Secondary | ICD-10-CM

## 2023-12-13 MED ORDER — FLUTICASONE PROPIONATE 50 MCG/ACT NA SUSP
2.0000 | Freq: Two times a day (BID) | NASAL | 6 refills | Status: AC
Start: 2023-12-13 — End: ?

## 2023-12-13 NOTE — Patient Instructions (Addendum)
 I have ordered an imaging study for you to complete prior to your next visit. Please call [facility] Scheduling at [phone] to schedule your imaging if you have not received a call within 24 hours. If you are unable to complete your imaging study prior to your next scheduled visit please call our office to let us know.   Use flonase two times per day two sprays each nostril  Papilloma of nose
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
## 2023-12-17 ENCOUNTER — Ambulatory Visit

## 2023-12-17 DIAGNOSIS — J342 Deviated nasal septum: Secondary | ICD-10-CM

## 2023-12-17 DIAGNOSIS — J3489 Other specified disorders of nose and nasal sinuses: Secondary | ICD-10-CM

## 2024-01-03 ENCOUNTER — Ambulatory Visit
Admission: EM | Admit: 2024-01-03 | Discharge: 2024-01-03 | Disposition: A | Discharge status: Home / Self Care | Attending: Family Medicine | Admitting: Family Medicine

## 2024-01-03 ENCOUNTER — Ambulatory Visit (INDEPENDENT_AMBULATORY_CARE_PROVIDER_SITE_OTHER)

## 2024-01-03 DIAGNOSIS — M545 Low back pain, unspecified: Secondary | ICD-10-CM

## 2024-01-03 DIAGNOSIS — S39012A Strain of muscle, fascia and tendon of lower back, initial encounter: Secondary | ICD-10-CM

## 2024-01-03 DIAGNOSIS — M62830 Muscle spasm of back: Secondary | ICD-10-CM

## 2024-01-03 MED ORDER — HYDROCODONE-ACETAMINOPHEN 5-325 MG PO TABS: 1.0000 | ORAL_TABLET | Freq: Three times a day (TID) | ORAL | 0 refills | Status: DC | PRN

## 2024-01-03 MED ORDER — BACLOFEN 10 MG PO TABS
10.0000 mg | ORAL_TABLET | Freq: Three times a day (TID) | ORAL | 0 refills | Status: DC
Start: 2024-01-03 — End: 2024-04-10

## 2024-01-03 MED ORDER — PREDNISONE 20 MG PO TABS: ORAL_TABLET | ORAL | 0 refills | Status: DC

## 2024-01-03 NOTE — ED Triage Notes (Signed)
 Pt presents to uc with co back pain since packing up books at home. Pt reports she is moving and has been packing this weekend. Pt reports pain is in the midback bilaterally constantly with some spasms. Pt reports she has taken otc motrin 800 mg q4.

## 2024-01-03 NOTE — ED Provider Notes (Signed)
 [facility]    CSN: [id number] Arrival date & time: 01/03/24  1603      History   Chief Complaint Chief Complaint  Patient presents with   Back Pain    HPI Kelli Frank is a 63 y.o. female.   HPI 63 year old female presents with back pain since picking up boxes at home this weekend.  Patient reports pain is more mid back with some spasms.  Patient reports taking Motrin 800 mg every 4 hours with little to no relief.  PMH significant for HTN and tobacco dependence.  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 Authorizing Provider  baclofen (LIORESAL) 10 MG tablet Take 1 tablet (10 mg total) by mouth 3 (three) times daily. 01/03/24  Yes Trevor Iha, FNP  HYDROcodone-acetaminophen (NORCO/VICODIN) 5-325 MG tablet Take 1 tablet by mouth every 8 (eight) hours as needed for up  to 7 days. 01/03/24 01/10/24 Yes Trevor Iha, FNP  predniSONE (DELTASONE) 20 MG tablet Take 3 tabs PO daily x 5 days. 01/03/24  Yes Trevor Iha, FNP  AMBULATORY NON FORMULARY MEDICATION Medication Name: Juice plus    [provider]  fluticasone (FLONASE) 50 MCG/ACT nasal spray Place 2 sprays into both nostrils in the morning and at bedtime. 12/13/23   Read Drivers, MD  traZODone (DESYREL) 50 MG tablet Take 0.5-2 tablets (25-100 mg total) by mouth at bedtime as needed for sleep. 10/18/23   Agapito Games, MD  valsartan (DIOVAN) 320 MG tablet Take 1 tablet (320 mg total) by mouth daily. 07/13/23   Agapito Games, MD    Family History Family History  Problem Relation Age of Onset   Heart attack Father    Hypertension Father    Hyperlipidemia Father    Hyperlipidemia Brother    Thyroid disease Brother    Cancer Other        grandfather/colorectal    Social History Social History   Tobacco Use   Smoking status: Former    Types: Cigarettes    Start date: 11/13/2021  Smokeless tobacco: Never   Tobacco comments:    hx of 25 packyears, now smoking 5-6 cig/day  Substance Use Topics   Alcohol use: Yes    Comment: occasional   Drug use: No     Allergies   Penicillins, Atorvastatin, Hydrochlorothiazide, and Tetanus-diphtheria toxoids td   Review of Systems Review of Systems  Musculoskeletal:  Positive for back pain.  All other systems reviewed and are negative.    Physical Exam Triage Vital Signs ED Triage Vitals  Encounter Vitals Group     BP 01/03/24 1655 (!) 194/78     Systolic BP Percentile --      Diastolic BP Percentile --      Pulse Rate 01/03/24 1655 62     Resp 01/03/24 1655 16     Temp 01/03/24 1655 98.4 F (36.9 C)     Temp src --      SpO2 01/03/24 1655 98 %     Weight --      Height --      Head Circumference --      Peak Flow --      Pain Score 01/03/24 1654 2     Pain Loc --      Pain Education --      Exclude from  Growth Chart --    No data found.  Updated Vital Signs BP (!) 194/78   Pulse 62   Temp 98.4 F (36.9 C)   Resp 16   LMP 06/01/2012   SpO2 98%   Physical Exam Vitals and nursing note reviewed.  Constitutional:      Appearance: Normal appearance. She is obese. She is ill-appearing.  HENT:     Head: Normocephalic and atraumatic.     Mouth/Throat:     Mouth: Mucous membranes are moist.     Pharynx: Oropharynx is clear.  Eyes:     Extraocular Movements: Extraocular movements intact.     Conjunctiva/sclera: Conjunctivae normal.     Pupils: Pupils are equal, round, and reactive to light.  Cardiovascular:     Rate and Rhythm: Normal rate and regular rhythm.     Pulses: Normal pulses.     Heart sounds: Normal heart sounds.  Pulmonary:     Effort: Pulmonary effort is normal.     Breath sounds: Normal breath sounds. No wheezing, rhonchi or rales.  Musculoskeletal:        General: Normal range of motion.     Cervical back: Normal range of motion and neck supple.     Comments: Patient reports mid to lower back pain bilaterally  Skin:    General: Skin is warm and dry.  Neurological:     General: No focal deficit present.     Mental Status: She is alert and oriented to person, place, and time. Mental status is at baseline.  Psychiatric:        Mood and Affect: Mood normal.        Behavior: Behavior normal.      UC Treatments / Results  Labs (all labs ordered are listed, but only abnormal results are displayed) Labs Reviewed - No data to display  EKG   Radiology No results found.  Procedures Procedures (including critical care time)  Medications Ordered in UC Medications - No data to display  Initial Impression / Assessment and Plan / UC Course  I have reviewed the triage vital signs and the nursing notes.  Pertinent labs & imaging results that were available during my  care of the patient were reviewed by me and considered in my medical decision making (see chart  for details).     MDM: 1.  Acute midline low back pain without sciatica-Rx'd Norco 5/325 mg tablet: Take 1 tablet every 8 hours, as needed for severe/acute back pain; 2.  Strain of lumbar region, initial encounter-Rx'd prednisone 20 mg tablet: Take 3 tabs p.o. daily x 5 days; 3.  Muscle spasm of back-Rx'd baclofen 10 mg tablet: Take 1 tablet 3 times daily, as needed. Advised patient to take medication as directed with food to completion.  Advised patient may take baclofen daily or as needed for accompanying muscle spasms of back.  Advised may take Norco for acute/severe back pain.  Patient advised of sedative effects of this medication.  Encouraged to increase daily water intake to 64 ounces per day while taking this medication.  Advised patient if symptoms worsen and/or unresolved please follow-up with your PCP or here for further evaluation.  Discharged home, hemodynamically stable. Final Clinical Impressions(s) / UC Diagnoses   Final diagnoses:  Strain of lumbar region, initial encounter  Acute midline low back pain without sciatica  Muscle spasm of back     Discharge Instructions      Advised patient to take medication as directed with food to completion.  Advised patient may take baclofen daily or as needed for accompanying muscle spasms of back.  Advised may take Norco for acute/severe back pain.  Patient advised of sedative effects of this medication.  Encouraged to increase daily water intake to 64 ounces per day while taking this medication.  Advised patient if symptoms worsen and/or unresolved please follow-up with your PCP or here for further evaluation.     ED Prescriptions     Medication Sig Dispense Auth. Provider   predniSONE (DELTASONE) 20 MG tablet Take 3 tabs PO daily x 5 days. 15 tablet Kuba Shepherd, FNP   baclofen (LIORESAL) 10 MG tablet Take 1 tablet (10 mg total) by mouth 3 (three) times daily. 30 each Comer Devins, FNP   HYDROcodone-acetaminophen (NORCO/VICODIN)  5-325 MG tablet Take 1 tablet by mouth every 8 (eight) hours as needed for up to 7 days. 21 tablet Shogo Larkey, FNP      I have reviewed the PDMP during this encounter.   Leonides Ramp, FNP 01/03/24 [phone]

## 2024-01-03 NOTE — Discharge Instructions (Addendum)
 Advised patient to take medication as directed with food to completion.  Advised patient may take baclofen daily or as needed for accompanying muscle spasms of back.  Advised may take Norco for acute/severe back pain.  Patient advised of sedative effects of this medication.  Encouraged to increase daily water intake to 64 ounces per day while taking this medication.  Advised patient if symptoms worsen and/or unresolved please follow-up with your PCP or here for further evaluation.

## 2024-01-07 ENCOUNTER — Ambulatory Visit (INDEPENDENT_AMBULATORY_CARE_PROVIDER_SITE_OTHER): Admitting: Otolaryngology

## 2024-01-07 ENCOUNTER — Encounter (INDEPENDENT_AMBULATORY_CARE_PROVIDER_SITE_OTHER): Payer: Self-pay

## 2024-01-07 VITALS — BP 171/90 | HR 83 | Ht 61.0 in | Wt 150.0 lb

## 2024-01-07 DIAGNOSIS — J343 Hypertrophy of nasal turbinates: Secondary | ICD-10-CM

## 2024-01-07 DIAGNOSIS — J342 Deviated nasal septum: Secondary | ICD-10-CM

## 2024-01-07 DIAGNOSIS — J3489 Other specified disorders of nose and nasal sinuses: Secondary | ICD-10-CM

## 2024-01-07 DIAGNOSIS — J349 Unspecified disorder of nose and nasal sinuses: Secondary | ICD-10-CM | POA: Diagnosis not present

## 2024-01-07 DIAGNOSIS — J320 Chronic maxillary sinusitis: Secondary | ICD-10-CM

## 2024-01-07 MED ORDER — PREDNISONE 10 MG PO TABS: 10.0000 mg | ORAL_TABLET | Freq: Every day | ORAL | 0 refills | Status: DC

## 2024-01-07 MED ORDER — DOXYCYCLINE HYCLATE 100 MG PO TABS: 100.0000 mg | ORAL_TABLET | Freq: Two times a day (BID) | ORAL | 0 refills | Status: DC
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
## 2024-01-07 NOTE — Patient Instructions (Addendum)
 Take Prednisone  by mouth 10mg  x 10 days. Start 5 days before surgery Take doxycyline by mouth for 10 days - again, start 5 days before surgery   Aureliano Med Nasal Saline Rinse  - start nasal saline rinses with NeilMed Bottle available over the counter    Nasal Saline Irrigation instructions: If you choose to make your own salt water solution, You will need: Salt (kosher, canning, or pickling salt) Baking soda Nasal irrigation bottle (i.e. Aureliano Med Sinus Rinse) Measuring spoon ( teaspoon) Distilled / boiled water   Mix solution Mix 1 teaspoon of salt, 1/2 teaspoon of baking soda and 1 cup of water into irrigation bottle ** May use saline packet instead of homemade recipe for this step if you prefer If medicine was prescribed to be mixed with solution, place this into bottle Examples 2 inches of 2% mupirocin ointment Budesonide solution Position your head: Lean over sink (about 45 degrees) Rotate head (about 45 degrees) so that one nostril is above the other Irrigate Insert tip of irrigation bottle into upper nostril so it forms a comfortable seal Irrigate while breathing through your mouth May remove the straw from the bottle in order to irrigate the entire solution (important if medicine was added) Exhale through nose when finished and blow nose as necessary  Repeat on opposite side with other 1/2 of solution (120 mL) or remake solution if all 240 mL was used on first side Wash irrigation bottle regularly, replace every 3 months

## 2024-01-10 ENCOUNTER — Encounter: Payer: Self-pay | Admitting: Family Medicine

## 2024-01-10 ENCOUNTER — Ambulatory Visit (INDEPENDENT_AMBULATORY_CARE_PROVIDER_SITE_OTHER): Payer: BC Managed Care – PPO | Admitting: Family Medicine

## 2024-01-10 VITALS — BP 177/72 | HR 68 | Ht 61.0 in | Wt 150.0 lb

## 2024-01-10 DIAGNOSIS — E876 Hypokalemia: Secondary | ICD-10-CM

## 2024-01-10 DIAGNOSIS — J339 Nasal polyp, unspecified: Secondary | ICD-10-CM

## 2024-01-10 DIAGNOSIS — I10 Essential (primary) hypertension: Secondary | ICD-10-CM | POA: Diagnosis not present

## 2024-01-10 DIAGNOSIS — E119 Type 2 diabetes mellitus without complications: Secondary | ICD-10-CM | POA: Diagnosis not present

## 2024-01-10 DIAGNOSIS — R7301 Impaired fasting glucose: Secondary | ICD-10-CM

## 2024-01-10 LAB — POCT GLYCOSYLATED HEMOGLOBIN (HGB A1C): Hemoglobin A1C: 6.5 % — AB (ref 4.0–5.6)

## 2024-01-10 MED ORDER — METFORMIN HCL ER 500 MG PO TB24: 500.0000 mg | ORAL_TABLET | Freq: Every day | ORAL | 1 refills | Status: DC

## 2024-01-10 MED ORDER — AMLODIPINE BESYLATE 5 MG PO TABS: 5.0000 mg | ORAL_TABLET | Freq: Every day | ORAL | 3 refills | Status: DC

## 2024-01-10 NOTE — Progress Notes (Signed)
 Established Patient Office Visit  Subjective  Patient ID: Kelli Frank, female    DOB: [birth year]  Age: 63 y.o. MRN: [id number]  Chief Complaint  Patient presents with   Hypertension    HPI Hypertension- Pt denies chest pain, SOB, dizziness, or heart palpitations.  Taking meds as directed w/o problems.  Denies medication side effects.    F/U IFG -still exercising regularly and has not had any diet changes.  But she has been on 2 rounds of prednisone  since I last saw her.  Also saw ENT, Dr. Lydia Sams.  They did a scope and she does have a polyp on that left side.  He did do a scan to and they are scheduling for surgery in July as she is moving in May and then has a big trip planned in June.    ROS    Objective:     BP (!) 177/72   Pulse 68   Ht 5\' 1"  (1.549 m)   Wt 150 lb (68 kg)   LMP 06/01/2012   SpO2 98%   BMI 28.34 kg/m    Physical Exam Vitals and nursing note reviewed.  Constitutional:      Appearance: Normal appearance.  HENT:     Head: Normocephalic and atraumatic.  Eyes:     Conjunctiva/sclera: Conjunctivae normal.  Cardiovascular:     Rate and Rhythm: Normal rate and regular rhythm.  Pulmonary:     Effort: Pulmonary effort is normal.     Breath sounds: Normal breath sounds.  Skin:    General: Skin is warm and dry.  Neurological:     Mental Status: She is alert.  Psychiatric:        Mood and Affect: Mood normal.      No results found for any visits on 01/10/24.    The 10-year ASCVD risk score (Arnett DK, et al., 2019) is: 19.1%    Assessment & Plan:   Problem List Items Addressed This Visit       Cardiovascular and Mediastinum   Primary hypertension   Sure has not been well-controlled since we took out the HCTZ component of her blood pressure pill because of hypokalemia.  Plan to recheck electrolytes today to make sure that it has corrected off of the diuretic and we will go ahead and start amlodipine  in addition to the valsartan .  Also  consider further workup for renal artery stenosis.  But it is important we get her blood pressure down before she has surgery in July.      Relevant Medications   amLODipine  (NORVASC ) 5 MG tablet   Other Relevant Orders   CMP14+EGFR     Endocrine   Controlled type 2 diabetes mellitus without complication, without long-term current use of insulin (HCC) - Primary   1C was 6.5 again today we discussed starting metformin  in addition to continuing to work on healthy diet and regular exercise.  New medication sent to pharmacy.  Potential side effects discussed including diarrhea.  Follow-up in 3 months.      Relevant Medications   metFORMIN  (GLUCOPHAGE -XR) 500 MG 24 hr tablet   Other Visit Diagnoses       Hypokalemia         Nasal polyp          HypoKalemia-due to recheck labs today to make sure that it has come completely corrected off of the hydrochlorothiazide .  It may just be that she also just has chronically low potassium.  Polyp-schedule for surgery in July.  Return in about 3 months (around 04/10/2024) for Diabetes follow-up.    Duaine German, MD

## 2024-01-10 NOTE — Assessment & Plan Note (Signed)
 1C was 6.5 again today we discussed starting metformin  in addition to continuing to work on healthy diet and regular exercise.  New medication sent to pharmacy.  Potential side effects discussed including diarrhea.  Follow-up in 3 months.

## 2024-01-10 NOTE — Assessment & Plan Note (Signed)
 Sure has not been well-controlled since we took out the HCTZ component of her blood pressure pill because of hypokalemia.  Plan to recheck electrolytes today to make sure that it has corrected off of the diuretic and we will go ahead and start amlodipine  in addition to the valsartan .  Also consider further workup for renal artery stenosis.  But it is important we get her blood pressure down before she has surgery in July.

## 2024-01-10 NOTE — Patient Instructions (Signed)
 Return for nurse visit in a few week for BP check

## 2024-01-11 ENCOUNTER — Encounter: Payer: Self-pay | Admitting: Family Medicine

## 2024-01-11 LAB — CMP14+EGFR
ALT: 18 IU/L (ref 0–32)
AST: 16 IU/L (ref 0–40)
Albumin: 4.3 g/dL (ref 3.9–4.9)
Alkaline Phosphatase: 148 IU/L — ABNORMAL HIGH (ref 44–121)
BUN/Creatinine Ratio: 22 (ref 12–28)
BUN: 19 mg/dL (ref 8–27)
Bilirubin Total: 0.3 mg/dL (ref 0.0–1.2)
CO2: 21 mmol/L (ref 20–29)
Calcium: 9.8 mg/dL (ref 8.7–10.3)
Chloride: 104 mmol/L (ref 96–106)
Creatinine, Ser: 0.88 mg/dL (ref 0.57–1.00)
Globulin, Total: 1.8 g/dL (ref 1.5–4.5)
Glucose: 160 mg/dL — ABNORMAL HIGH (ref 70–99)
Potassium: 3.9 mmol/L (ref 3.5–5.2)
Sodium: 142 mmol/L (ref 134–144)
Total Protein: 6.1 g/dL (ref 6.0–8.5)
eGFR: 75 mL/min/{1.73_m2} (ref >59)

## 2024-01-11 NOTE — Progress Notes (Signed)
 Hi Kelli Frank, alkaline phosphatase which is from the liver is mildly elevated.  Your additional liver enzymes are normal so that is reassuring.  Just avoid any excess Tylenol  or alcohol products and work on healthy diet.  Your blood pressure was also up so that could be contributing as well.  I want to have you come in in about 3 to 4 weeks for nurse visit for blood pressure to make sure that it has come back down.  I know you have a lot going on so if you can fit that in between your travel and move that would be great and then we can recheck your liver function at that time.

## 2024-01-19 ENCOUNTER — Other Ambulatory Visit: Payer: Self-pay | Admitting: *Deleted

## 2024-01-19 DIAGNOSIS — I10 Essential (primary) hypertension: Secondary | ICD-10-CM

## 2024-01-19 MED ORDER — VALSARTAN 320 MG PO TABS: 320.0000 mg | ORAL_TABLET | Freq: Every day | ORAL | 1 refills | Status: DC

## 2024-01-31 ENCOUNTER — Ambulatory Visit (INDEPENDENT_AMBULATORY_CARE_PROVIDER_SITE_OTHER)

## 2024-01-31 VITALS — BP 99/64 | HR 78 | Ht 61.0 in | Wt 150.0 lb

## 2024-01-31 DIAGNOSIS — I10 Essential (primary) hypertension: Secondary | ICD-10-CM

## 2024-01-31 NOTE — Patient Instructions (Signed)
 Continue with current medications. Stay hydrated. Keep July appointment.

## 2024-01-31 NOTE — Progress Notes (Unsigned)
 Patient is here for blood pressure check.   Previous BP was 177/72  1st BP today: 99/64  Denies chest pain, dizziness, shortness of breath, severe headache, or nosebleeds.  Taking medication as prescribed. Denies missed doses.  Well hydrated.   Per Dr. Greer Leak: Continue with current medications. Keep 03/2024 appt.

## 2024-02-04 ENCOUNTER — Other Ambulatory Visit: Payer: Self-pay | Admitting: *Deleted

## 2024-02-04 DIAGNOSIS — F5101 Primary insomnia: Secondary | ICD-10-CM

## 2024-02-04 MED ORDER — TRAZODONE HCL 50 MG PO TABS: 25.0000 mg | ORAL_TABLET | Freq: Every evening | ORAL | 3 refills | Status: DC | PRN

## 2024-02-10 ENCOUNTER — Encounter: Payer: Self-pay | Admitting: Family Medicine

## 2024-02-10 ENCOUNTER — Ambulatory Visit: Admitting: Family Medicine

## 2024-02-10 VITALS — BP 137/65 | HR 76 | Ht 61.0 in | Wt 147.2 lb

## 2024-02-10 DIAGNOSIS — M7989 Other specified soft tissue disorders: Secondary | ICD-10-CM

## 2024-02-10 DIAGNOSIS — Z1211 Encounter for screening for malignant neoplasm of colon: Secondary | ICD-10-CM

## 2024-02-10 DIAGNOSIS — Z1231 Encounter for screening mammogram for malignant neoplasm of breast: Secondary | ICD-10-CM

## 2024-02-10 DIAGNOSIS — E119 Type 2 diabetes mellitus without complications: Secondary | ICD-10-CM

## 2024-02-10 DIAGNOSIS — I10 Essential (primary) hypertension: Secondary | ICD-10-CM

## 2024-02-10 DIAGNOSIS — E876 Hypokalemia: Secondary | ICD-10-CM

## 2024-02-10 LAB — POCT UA - MICROALBUMIN
Albumin/Creatinine Ratio, Urine, POC: <30
Creatinine, POC: 200 mg/dL
Microalbumin Ur, POC: 80 mg/L

## 2024-02-10 MED ORDER — VALSARTAN-HYDROCHLOROTHIAZIDE 160-12.5 MG PO TABS: 1.0000 | ORAL_TABLET | Freq: Every day | ORAL | Status: DC

## 2024-02-10 NOTE — Assessment & Plan Note (Signed)
 Unfortunately had significant side effects with amlodipine  including swelling in her hands and feet, bloating, fatigue, brain fog and just feeling off in general.  No headaches.  She would really like to get back to the valsartan  HCT and just take a potassium supplement she said she felt fantastic on that regimen her blood pressures always look great and she never had any symptoms or side effects.  I think it is reasonable to switch back if she is willing to take potassium with it.  We can recheck levels in 2 weeks.

## 2024-02-10 NOTE — Patient Instructions (Signed)
   550mg . - take one daily   Recheck labs in 3 weeks.

## 2024-02-10 NOTE — Assessment & Plan Note (Signed)
 Due for urine micro todayl

## 2024-02-10 NOTE — Progress Notes (Signed)
 Established Patient Office Visit  Subjective  Patient ID: Kelli Frank, female    DOB: [birth year]  Age: 63 y.o. MRN: [id number]  Chief Complaint  Patient presents with   Hypertension    Patient states checked BP at work last week ( can not remember the day) and was 144/78   bilateral feet and ankles swelling    Patient states since stopping Valsartan  /hydrochlorothiazide  due to low potassium  and switching to plain valsartan  ad adding Amlodipine  -she has had lower extremity swelling (bilateral ankles and feet, just feels "off"  'feels like head is in a bubble" , like I am on prednisone " , lethargy. She would like to know if she can go back to Valsartan  /hydrochlorothiazide  and take a potassium supplement - states she felt better while taking this.     HPI  She is here today for some swelling in her extremities that she noticed started after she was put on amlodipine  for her blood pressure about 4 weeks ago.    Patient states since stopping Valsartan  /hydrochlorothiazide  due to low potassium  and switching to plain valsartan  ad adding Amlodipine  -she has had lower extremity swelling (bilateral ankles and feet, just feels "off"  'feels like head is in a bubble" , like I am on prednisone " , lethargy. She would like to know if she can go back to Valsartan  /hydrochlorothiazide  and take a potassium supplement - states she felt better while taking this.        ROS    Objective:     BP 137/65   Pulse 76   Ht 5\' 1"  (1.549 m)   Wt 147 lb 4 oz (66.8 kg)   LMP 06/01/2012   SpO2 97%   BMI 27.82 kg/m    Physical Exam Vitals and nursing note reviewed.  Constitutional:      Appearance: Normal appearance.  HENT:     Head: Normocephalic and atraumatic.  Eyes:     Conjunctiva/sclera: Conjunctivae normal.  Cardiovascular:     Rate and Rhythm: Normal rate and regular rhythm.  Pulmonary:     Effort: Pulmonary effort is normal.     Breath sounds: Normal breath sounds.  Skin:     General: Skin is warm and dry.  Neurological:     Mental Status: She is alert.  Psychiatric:        Mood and Affect: Mood normal.      Results for orders placed or performed in visit on 02/10/24  POCT UA - Microalbumin  Result Value Ref Range   Microalbumin Ur, POC 80 mg/L   Creatinine, POC 200 mg/dL   Albumin/Creatinine Ratio, Urine, POC <30       The 10-year ASCVD risk score (Arnett DK, et al., 2019) is: 11.9%    Assessment & Plan:   Problem List Items Addressed This Visit       Cardiovascular and Mediastinum   Primary hypertension   Unfortunately had significant side effects with amlodipine  including swelling in her hands and feet, bloating, fatigue, brain fog and just feeling off in general.  No headaches.  She would really like to get back to the valsartan  HCT and just take a potassium supplement she said she felt fantastic on that regimen her blood pressures always look great and she never had any symptoms or side effects.  I think it is reasonable to switch back if she is willing to take potassium with it.  We can recheck levels in 2 weeks.  Relevant Medications   valsartan -hydrochlorothiazide  (DIOVAN -HCT) 160-12.5 MG tablet     Endocrine   Controlled type 2 diabetes mellitus without complication, without long-term current use of insulin (HCC)   Due for urine micro todayl       Relevant Medications   valsartan -hydrochlorothiazide  (DIOVAN -HCT) 160-12.5 MG tablet   Other Relevant Orders   POCT UA - Microalbumin (Completed)   Other Visit Diagnoses       Swelling of lower extremity    -  Primary     Screening for colorectal cancer       Relevant Orders   Cologuard     Encounter for screening mammogram for malignant neoplasm of breast       Relevant Orders   MM 3D SCREENING MAMMOGRAM BILATERAL BREAST     Hypokalemia       Relevant Orders   Basic Metabolic Panel (BMET)      Follow-up in July with A1c.  No follow-ups on file.    Duaine German,  MD

## 2024-03-13 ENCOUNTER — Telehealth (INDEPENDENT_AMBULATORY_CARE_PROVIDER_SITE_OTHER): Payer: Self-pay | Admitting: Otolaryngology

## 2024-03-13 NOTE — Telephone Encounter (Signed)
 Patient requested a phone call regarding surgery questions.Please advise

## 2024-03-14 ENCOUNTER — Telehealth (INDEPENDENT_AMBULATORY_CARE_PROVIDER_SITE_OTHER): Payer: Self-pay | Admitting: Otolaryngology

## 2024-03-14 NOTE — Telephone Encounter (Signed)
 Patient called with questions regarding Surgery postop instructions. Would you please call her back at [phone]. Thanks!

## 2024-03-21 ENCOUNTER — Telehealth (INDEPENDENT_AMBULATORY_CARE_PROVIDER_SITE_OTHER): Payer: Self-pay | Admitting: Otolaryngology

## 2024-03-21 NOTE — Telephone Encounter (Signed)
 Called and spoke with patient.  I let her know that Dr. Tobie has completed her FMLA Paperwork.  Patient paid Paperwork Fee of $25 on 03/13/2024.  She will be stopping by to pick up the paperwork.

## 2024-03-31 ENCOUNTER — Telehealth (INDEPENDENT_AMBULATORY_CARE_PROVIDER_SITE_OTHER): Payer: Self-pay | Admitting: Otolaryngology

## 2024-03-31 DIAGNOSIS — J0101 Acute recurrent maxillary sinusitis: Secondary | ICD-10-CM | POA: Diagnosis not present

## 2024-03-31 DIAGNOSIS — J342 Deviated nasal septum: Secondary | ICD-10-CM | POA: Diagnosis not present

## 2024-03-31 DIAGNOSIS — J3489 Other specified disorders of nose and nasal sinuses: Secondary | ICD-10-CM | POA: Diagnosis not present

## 2024-03-31 DIAGNOSIS — J343 Hypertrophy of nasal turbinates: Secondary | ICD-10-CM | POA: Diagnosis not present

## 2024-03-31 MED ORDER — TRAMADOL HCL 50 MG PO TABS: 50.0000 mg | ORAL_TABLET | Freq: Four times a day (QID) | ORAL | 0 refills | Status: AC | PRN

## 2024-03-31 MED ORDER — SALINE SPRAY 0.65 % NA SOLN: 2.0000 | NASAL | 5 refills | Status: DC | PRN

## 2024-03-31 MED ORDER — ACETAMINOPHEN 500 MG PO TABS: 1000.0000 mg | ORAL_TABLET | Freq: Four times a day (QID) | ORAL | 0 refills | Status: AC | PRN

## 2024-03-31 MED ORDER — IBUPROFEN 200 MG PO TABS: 400.0000 mg | ORAL_TABLET | Freq: Four times a day (QID) | ORAL | 0 refills | Status: DC | PRN

## 2024-03-31 NOTE — Telephone Encounter (Signed)
 ENT Note: L FESS, septo, turbs, excision of nasal mass done at [facility] on 03/31/2024. Will d/c with tylenol , ibuprofen , tramadol  50mg  PO q6h PRN #10; finish peri-op abx/steroids. F/u scheduled

## 2024-04-05 ENCOUNTER — Encounter (INDEPENDENT_AMBULATORY_CARE_PROVIDER_SITE_OTHER): Payer: Self-pay | Admitting: Otolaryngology

## 2024-04-05 ENCOUNTER — Telehealth (INDEPENDENT_AMBULATORY_CARE_PROVIDER_SITE_OTHER): Payer: Self-pay

## 2024-04-05 ENCOUNTER — Ambulatory Visit (INDEPENDENT_AMBULATORY_CARE_PROVIDER_SITE_OTHER): Admitting: Otolaryngology

## 2024-04-05 VITALS — BP 132/74 | HR 71 | Ht 61.0 in | Wt 150.0 lb

## 2024-04-05 DIAGNOSIS — J320 Chronic maxillary sinusitis: Secondary | ICD-10-CM | POA: Diagnosis not present

## 2024-04-05 DIAGNOSIS — Z9889 Other specified postprocedural states: Secondary | ICD-10-CM

## 2024-04-05 DIAGNOSIS — J3489 Other specified disorders of nose and nasal sinuses: Secondary | ICD-10-CM

## 2024-04-05 DIAGNOSIS — J343 Hypertrophy of nasal turbinates: Secondary | ICD-10-CM

## 2024-04-05 DIAGNOSIS — J342 Deviated nasal septum: Secondary | ICD-10-CM

## 2024-04-05 NOTE — Telephone Encounter (Signed)
 Called and let the patient know that her f/u appointment was made for next Wednesday at 8:30. I also let her know the instructions on her AVS and let her know that she could find her AVS on her mychart. Patient understood.

## 2024-04-05 NOTE — Patient Instructions (Signed)
 Use saline rinses - daily in 3 days as long as no bleeding Use flonase  two sprays each nostril twice daily No nose blowing for 5 days, then ok to gently blow Use afrin 3 sprays each nostril three times per day for 2 days.

## 2024-04-05 NOTE — Progress Notes (Signed)
 S/p FESS and septo/turbs and nasal mass excision on 03/31/2024 S: Did have a fair amount of bleeding POD 0/1, but this has slowed down significantly. Pain well controlled. No fevers. Finishing perioperative abx/steroid. Otherwise no significant issues. Path pending.  O: Doyle splints in place, removed; after removal, no septal hematoma noted; Able to visualize axilla of MT bilaterally; tubinates reduced; no active bleeding. No evidence of infection. Nasal endoscopy was indicated to better evaluate the nose and paranasal sinuses, given the patient's history of recent sinus surgery and exam findings, and is detailed below. Prior to proceeding, verbal consent was obtained.   PROCEDURE: Bilateral Diagnostic Rigid Nasal Endoscopy with Left Debridement Pre-procedure diagnosis: Post-operative examination and care after Left Functional Endoscopic Sinus Surgery - of note: this procedure was NOT performed for management of the septoplasty or the turbinate reduction or mass excision Post-procedure diagnosis: same Indication: See pre-procedure diagnosis and physical exam above Complications: None apparent EBL: 0 mL Anesthesia: Lidocaine  4% and topical decongestant was topically sprayed in each nasal cavity   Description of Procedure:  Patient was identified. A rigid 30 degree endoscope was utilized to evaluate the sinonasal cavities, mucosa, sinus ostia and turbinates and septum.  Overall, signs of mucosal inflammation are noted. Also noted are post-surgical changes with some crusting and clot within left middle meatus. These were debrided with a 8 Fr suction. After debridement, sinus cavity patency was improved. No adverse synechiae are noted today Right Middle meatus: patent Right SE Recess:  patent Left MM: more patent Left SE Recess: patent   CPT CODE -- 31237 - Mod 79   A/P: 63 y.o. female with left nasal mass, nasal congestion, nasal septal deviation and nasal obstruction s/p left FESS and  septoplasty.and ITR and nasal mass excision After splint removal, patient is doing better with improved breathing Sinus debrided. Resume nasal sprays as pre-op Start nasal saline rinses BID after bleeding stops No nose blowing additional 5 days Can use afrin TID x2 days F/u 1 week - review path next week when back

## 2024-04-10 ENCOUNTER — Ambulatory Visit: Admitting: Family Medicine

## 2024-04-10 VITALS — BP 109/56 | HR 87 | Ht 61.0 in | Wt 148.0 lb

## 2024-04-10 DIAGNOSIS — I10 Essential (primary) hypertension: Secondary | ICD-10-CM | POA: Diagnosis not present

## 2024-04-10 DIAGNOSIS — E119 Type 2 diabetes mellitus without complications: Secondary | ICD-10-CM | POA: Diagnosis not present

## 2024-04-10 DIAGNOSIS — R748 Abnormal levels of other serum enzymes: Secondary | ICD-10-CM | POA: Diagnosis not present

## 2024-04-10 DIAGNOSIS — J3489 Other specified disorders of nose and nasal sinuses: Secondary | ICD-10-CM | POA: Diagnosis not present

## 2024-04-10 LAB — POCT GLYCOSYLATED HEMOGLOBIN (HGB A1C): Hemoglobin A1C: 6.5 % — AB (ref 4.0–5.6)

## 2024-04-10 MED ORDER — VALSARTAN-HYDROCHLOROTHIAZIDE 160-12.5 MG PO TABS: 1.0000 | ORAL_TABLET | Freq: Every day | ORAL | 1 refills | Status: AC

## 2024-04-10 NOTE — Assessment & Plan Note (Signed)
 Well controlled. Continue current regimen. Follow up in  7mo due for labs to recheck potassium

## 2024-04-10 NOTE — Progress Notes (Signed)
 Established Patient Office Visit  Subjective  Patient ID: Kelli Frank, female    DOB: 26-Jul-1961  Age: 63 y.o. MRN: [id number]  Chief Complaint  Patient presents with   Diabetes    HPI  Diabetes - no hypoglycemic events. No wounds or sores that are not healing well. No increased thirst or urination. Checking glucose at home. Taking medications as prescribed without any side effects.  She did well with her nasal surgery/she had some excess bleeding right after the surgery but otherwise has done okay she still has stent stitched in place she wants me to check her right nostril today she feels a little bump there to Mohs feels a little sore like a pimple.  Hypertension follow-up-overall doing really well we had recently switched her blood pressure regimen around to Diovan  HCTZ.  She feels good on the medication she has been taking some over-the-counter potassium daily.  She also had a partial toenail removal on her left great toe.  There is a little dark spot there and so she wanted to just make sure that it was okay and if she would be okay to cut it or trim it off..      ROS    Objective:     BP (!) 109/56   Pulse 87   Ht 5' 1 (1.549 m)   Wt 148 lb (67.1 kg)   LMP 06/01/2012   SpO2 100%   BMI 27.96 kg/m    Physical Exam Vitals and nursing note reviewed.  Constitutional:      Appearance: Normal appearance.  HENT:     Head: Normocephalic and atraumatic.  Eyes:     Conjunctiva/sclera: Conjunctivae normal.  Cardiovascular:     Rate and Rhythm: Normal rate and regular rhythm.  Pulmonary:     Effort: Pulmonary effort is normal.     Breath sounds: Normal breath sounds.  Skin:    General: Skin is warm and dry.  Neurological:     Mental Status: She is alert.  Psychiatric:        Mood and Affect: Mood normal.    Results for orders placed or performed in visit on 04/10/24  POCT HgB A1C  Result Value Ref Range   Hemoglobin A1C 6.5 (A) 4.0 - 5.6 %   HbA1c POC (<>  result, manual entry)     HbA1c, POC (prediabetic range)     HbA1c, POC (controlled diabetic range)        The 10-year ASCVD risk score (Arnett DK, et al., 2019) is: 8.4%    Assessment & Plan:   Problem List Items Addressed This Visit       Cardiovascular and Mediastinum   Primary hypertension   Well controlled. Continue current regimen. Follow up in  55mo due for labs to recheck potassium       Relevant Medications   valsartan -hydrochlorothiazide  (DIOVAN -HCT) 160-12.5 MG tablet   Other Relevant Orders   CMP14+EGFR     Endocrine   Controlled type 2 diabetes mellitus without complication, without long-term current use of insulin (HCC) - Primary   Tolerating metformin  well. Continue current regimen. F/U in 6 months.        Relevant Medications   valsartan -hydrochlorothiazide  (DIOVAN -HCT) 160-12.5 MG tablet   Other Relevant Orders   POCT HgB A1C (Completed)   CMP14+EGFR   Other Visit Diagnoses       Elevated alkaline phosphatase level       Relevant Orders   CMP14+EGFR     Nasal lesion  Elevated alk phos  -elevated on last set of labs so would like to recheck that today.  Due to recheck   Nasal lesion-I did look at the area in her left nostril.  It does look like a suture.  She does have her follow-up scheduled with the ENT later this week.  It looks like the absorbable kind so should eventually resolve on its own.  I encouraged her not to cut the area on her toe just to let it grow out.     Return in about 6 months (around 10/11/2024) for Hypertension, Diabetes follow-up.    Dorothyann Byars, MD

## 2024-04-10 NOTE — Assessment & Plan Note (Signed)
 Tolerating metformin  well. Continue current regimen. F/U in 6 months.

## 2024-04-11 ENCOUNTER — Ambulatory Visit: Payer: Self-pay | Admitting: Family Medicine

## 2024-04-11 DIAGNOSIS — R899 Unspecified abnormal finding in specimens from other organs, systems and tissues: Secondary | ICD-10-CM

## 2024-04-11 LAB — CMP14+EGFR
ALT: 15 IU/L (ref 0–32)
AST: 15 IU/L (ref 0–40)
Albumin: 3.8 g/dL — ABNORMAL LOW (ref 3.9–4.9)
Alkaline Phosphatase: 164 IU/L — ABNORMAL HIGH (ref 44–121)
BUN/Creatinine Ratio: 26 (ref 12–28)
BUN: 23 mg/dL (ref 8–27)
Bilirubin Total: 0.2 mg/dL (ref 0.0–1.2)
CO2: 22 mmol/L (ref 20–29)
Calcium: 9.3 mg/dL (ref 8.7–10.3)
Chloride: 103 mmol/L (ref 96–106)
Creatinine, Ser: 0.88 mg/dL (ref 0.57–1.00)
Globulin, Total: 2 g/dL (ref 1.5–4.5)
Glucose: 174 mg/dL — ABNORMAL HIGH (ref 70–99)
Potassium: 3.3 mmol/L — ABNORMAL LOW (ref 3.5–5.2)
Sodium: 141 mmol/L (ref 134–144)
Total Protein: 5.8 g/dL — ABNORMAL LOW (ref 6.0–8.5)
eGFR: 74 mL/min/1.73 (ref >59)

## 2024-04-11 NOTE — Progress Notes (Signed)
 Hi Kelli Frank, potassium still little borderline again.  You want to try increasing her potassium to twice a day?  And we will see if that is helpful.  Your protein and albumin levels are just a little borderline low so I did not would encourage you to increase your protein intake in your diet this can come from plants or meats.  Alkaline phosphatase is also up just a little bit at 164 it was 148 last time.  I do want a keep an eye on this and plan to recheck again in about 6 to 8 weeks.  If it still elevated at that time then we may get an ultrasound of your liver.

## 2024-04-12 ENCOUNTER — Encounter (INDEPENDENT_AMBULATORY_CARE_PROVIDER_SITE_OTHER): Payer: Self-pay | Admitting: Otolaryngology

## 2024-04-12 ENCOUNTER — Ambulatory Visit (INDEPENDENT_AMBULATORY_CARE_PROVIDER_SITE_OTHER): Admitting: Otolaryngology

## 2024-04-12 VITALS — BP 144/80 | HR 89 | Ht 61.0 in | Wt 150.0 lb

## 2024-04-12 DIAGNOSIS — J320 Chronic maxillary sinusitis: Secondary | ICD-10-CM | POA: Diagnosis not present

## 2024-04-12 DIAGNOSIS — J342 Deviated nasal septum: Secondary | ICD-10-CM

## 2024-04-12 DIAGNOSIS — J3489 Other specified disorders of nose and nasal sinuses: Secondary | ICD-10-CM

## 2024-04-12 DIAGNOSIS — Z9889 Other specified postprocedural states: Secondary | ICD-10-CM

## 2024-04-12 DIAGNOSIS — J343 Hypertrophy of nasal turbinates: Secondary | ICD-10-CM

## 2024-04-12 NOTE — Progress Notes (Signed)
 S/p Left FESS and septo/turbs and nasal mass excision on 03/31/2024  S: Bleeding resolved. Pain well controlled. No fevers. Doing rinses, and nasal sprays. Otherwise no significant issues. Path still pending, will contact [facility]/Path  O: No septal hematoma noted; Able to visualize axilla of MT bilaterally; tubinates reduced; no active bleeding. No evidence of infection. Nasal endoscopy was indicated to better evaluate the nose and paranasal sinuses, given the patient's history of recent sinus surgery and exam findings, and is detailed below. Prior to proceeding, verbal consent was obtained.   PROCEDURE: Bilateral Diagnostic Rigid Nasal Endoscopy with Left Debridement Pre-procedure diagnosis: Post-operative examination and care after Left Functional Endoscopic Sinus Surgery - of note: this procedure was NOT performed for management of the septoplasty or the turbinate reduction or mass excision Post-procedure diagnosis: same Indication: See pre-procedure diagnosis and physical exam above Complications: None apparent EBL: 0 mL Anesthesia: Lidocaine  4% and topical decongestant was topically sprayed in each nasal cavity   Description of Procedure:  Patient was identified. A rigid 30 degree endoscope was utilized to evaluate the sinonasal cavities, mucosa, sinus ostia and turbinates and septum.  Overall, signs of mucosal inflammation are noted but improving. Also noted are post-surgical changes with continued significant crusting within left middle meatus. This was debrided with a 8 Fr suction. After debridement, sinus cavity patency was improved. No adverse synechiae are noted today Right Middle meatus: patent Right SE Recess:  patent Left MM: more patent Left SE Recess: patent   CPT CODE -- 68762   A/P: 63 y.o. female with left nasal mass, nasal congestion, nasal septal deviation and nasal obstruction s/p left FESS and septoplasty.and ITR and nasal mass excision Breathing improved Sinus debrided  today Continue nasal sprays as pre-op Continue nasal saline rinses BID F/u 3 weeks  Addendum: Path reviewed -- noted inverted papilloma, no malignancy.   Alizea Pell B Mahkayla Preece

## 2024-04-20 ENCOUNTER — Encounter (INDEPENDENT_AMBULATORY_CARE_PROVIDER_SITE_OTHER): Payer: Self-pay | Admitting: Otolaryngology

## 2024-05-03 ENCOUNTER — Encounter (INDEPENDENT_AMBULATORY_CARE_PROVIDER_SITE_OTHER): Payer: Self-pay | Admitting: Otolaryngology

## 2024-05-03 ENCOUNTER — Ambulatory Visit (INDEPENDENT_AMBULATORY_CARE_PROVIDER_SITE_OTHER): Admitting: Otolaryngology

## 2024-05-03 VITALS — BP 131/78 | HR 80

## 2024-05-03 DIAGNOSIS — J343 Hypertrophy of nasal turbinates: Secondary | ICD-10-CM

## 2024-05-03 DIAGNOSIS — J320 Chronic maxillary sinusitis: Secondary | ICD-10-CM | POA: Diagnosis not present

## 2024-05-03 DIAGNOSIS — J342 Deviated nasal septum: Secondary | ICD-10-CM

## 2024-05-03 DIAGNOSIS — Z9889 Other specified postprocedural states: Secondary | ICD-10-CM

## 2024-05-03 DIAGNOSIS — J3489 Other specified disorders of nose and nasal sinuses: Secondary | ICD-10-CM

## 2024-05-03 NOTE — Progress Notes (Signed)
 S/p Left FESS and septo/turbs and nasal mass excision on 03/31/2024  S: Pain well controlled. No fevers. Doing rinses, and nasal sprays. Otherwise no significant issues. Pathology discussed, showing inverted papilloma  O: Septum intact, no perforation. Able to visualize axilla of MT bilaterally; tubinates reduced; no active bleeding. No evidence of infection. Nasal endoscopy was indicated to better evaluate the nose and paranasal sinuses, given the patient's history of recent sinus surgery and exam findings, and is detailed below. Prior to proceeding, verbal consent was obtained.   PROCEDURE: Bilateral Diagnostic Rigid Nasal Endoscopy with Left Debridement Pre-procedure diagnosis: Post-operative examination and care after Left Functional Endoscopic Sinus Surgery - of note: this procedure was NOT performed for management of the septoplasty or the turbinate reduction or mass excision Post-procedure diagnosis: same Indication: See pre-procedure diagnosis and physical exam above Complications: None apparent EBL: 0 mL Anesthesia: Lidocaine  4% and topical decongestant was topically sprayed in each nasal cavity   Description of Procedure:  Patient was identified. A rigid 30 degree endoscope was utilized to evaluate the sinonasal cavities, mucosa, sinus ostia and turbinates and septum.  Overall, signs of mucosal inflammation are noted but improving. Also noted are post-surgical changes with continued significant crusting within left middle meatus but improving. This was debrided with a 8 Fr suction. After debridement, sinus cavity patency was improved. No adverse synechiae are noted today Right Middle meatus: patent Right SE Recess:  patent Left MM: more patent Left SE Recess: patent   CPT CODE -- 68762   A/P: 63 y.o. female with left nasal mass, nasal congestion, nasal septal deviation and nasal obstruction s/p left FESS and septoplasty.and ITR and nasal mass excision Breathing improved Sinus  debrided today Continue nasal sprays as pre-op Continue nasal saline rinses BID F/u 6 weeks  Kelli Frank Blanch

## 2024-05-03 NOTE — Progress Notes (Signed)
 S/p Left FESS and septo/turbs and nasal mass excision on 03/31/2024  S: Pain well controlled. No fevers. Doing rinses, and nasal sprays. Otherwise no significant issues. Pathology discussed, showing inverted papilloma  O: Septum intact, no perforation. Able to visualize axilla of MT bilaterally; tubinates reduced; no active bleeding. No evidence of infection. Nasal endoscopy was indicated to better evaluate the nose and paranasal sinuses, given the patient's history of recent sinus surgery and exam findings, and is detailed below. Prior to proceeding, verbal consent was obtained.   PROCEDURE: Bilateral Diagnostic Rigid Nasal Endoscopy with Left Debridement Pre-procedure diagnosis: Post-operative examination and care after Left Functional Endoscopic Sinus Surgery - of note: this procedure was NOT performed for management of the septoplasty or the turbinate reduction or mass excision Post-procedure diagnosis: same Indication: See pre-procedure diagnosis and physical exam above Complications: None apparent EBL: 0 mL Anesthesia: Lidocaine 4% and topical decongestant was topically sprayed in each nasal cavity   Description of Procedure:  Patient was identified. A rigid 30 degree endoscope was utilized to evaluate the sinonasal cavities, mucosa, sinus ostia and turbinates and septum.  Overall, signs of mucosal inflammation are noted but improving. Also noted are post-surgical changes with continued significant crusting within left middle meatus but improving. This was debrided with a 8 Fr suction. After debridement, sinus cavity patency was improved. No adverse synechiae are noted today Right Middle meatus: patent Right SE Recess:  patent Left MM: more patent Left SE Recess: patent   CPT CODE -- 68762   A/P: 63 y.o. female with left nasal mass, nasal congestion, nasal septal deviation and nasal obstruction s/p left FESS and septoplasty.and ITR and nasal mass excision Breathing improved Sinus  debrided today Continue nasal sprays as pre-op Continue nasal saline rinses BID F/u 6 weeks  Eldora KATHEE Blanch

## 2024-05-16 LAB — CMP14+EGFR
ALT: 23 IU/L (ref 0–32)
AST: 27 IU/L (ref 0–40)
Albumin: 4.4 g/dL (ref 3.9–4.9)
Alkaline Phosphatase: 127 IU/L — ABNORMAL HIGH (ref 44–121)
BUN/Creatinine Ratio: 20 (ref 12–28)
BUN: 18 mg/dL (ref 8–27)
Bilirubin Total: 0.3 mg/dL (ref 0.0–1.2)
CO2: 22 mmol/L (ref 20–29)
Calcium: 9.9 mg/dL (ref 8.7–10.3)
Chloride: 100 mmol/L (ref 96–106)
Creatinine, Ser: 0.92 mg/dL (ref 0.57–1.00)
Globulin, Total: 2.3 g/dL (ref 1.5–4.5)
Glucose: 117 mg/dL — ABNORMAL HIGH (ref 70–99)
Potassium: 4.1 mmol/L (ref 3.5–5.2)
Sodium: 139 mmol/L (ref 134–144)
Total Protein: 6.7 g/dL (ref 6.0–8.5)
eGFR: 70 mL/min/1.73 (ref >59)

## 2024-05-16 NOTE — Progress Notes (Signed)
 Alk phos is getting better!

## 2024-06-01 ENCOUNTER — Ambulatory Visit

## 2024-06-01 DIAGNOSIS — Z1231 Encounter for screening mammogram for malignant neoplasm of breast: Secondary | ICD-10-CM | POA: Diagnosis not present

## 2024-06-06 ENCOUNTER — Ambulatory Visit: Payer: Self-pay | Admitting: Family Medicine

## 2024-06-06 NOTE — Progress Notes (Signed)
 Please call patient. Normal mammogram.  Repeat in 1 year.

## 2024-06-19 ENCOUNTER — Ambulatory Visit (INDEPENDENT_AMBULATORY_CARE_PROVIDER_SITE_OTHER): Admitting: Otolaryngology

## 2024-06-19 ENCOUNTER — Encounter (INDEPENDENT_AMBULATORY_CARE_PROVIDER_SITE_OTHER): Payer: Self-pay | Admitting: Otolaryngology

## 2024-06-19 VITALS — BP 146/87 | HR 86 | Ht 61.0 in | Wt 150.0 lb

## 2024-06-19 DIAGNOSIS — J320 Chronic maxillary sinusitis: Secondary | ICD-10-CM

## 2024-06-19 DIAGNOSIS — J343 Hypertrophy of nasal turbinates: Secondary | ICD-10-CM

## 2024-06-19 DIAGNOSIS — J342 Deviated nasal septum: Secondary | ICD-10-CM

## 2024-06-19 DIAGNOSIS — Z9889 Other specified postprocedural states: Secondary | ICD-10-CM

## 2024-06-19 DIAGNOSIS — J3489 Other specified disorders of nose and nasal sinuses: Secondary | ICD-10-CM

## 2024-06-19 NOTE — Progress Notes (Signed)
 S/p Left FESS and septo/turbs and nasal mass excision on 03/31/2024  S: Pain well controlled. No fevers. Doing rinses most of the time, and nasal sprays. Otherwise no significant issues. She reports she is breathing very well out of the nose and is happy with how she has done.  O: Septum intact, no perforation. Able to visualize axilla of MT bilaterally; tubinates reduced; no active bleeding. No evidence of infection but modest crusting on left. Nasal endoscopy was indicated to better evaluate the nose and paranasal sinuses, given the patient's history of recent sinus surgery and exam findings, and is detailed below. Prior to proceeding, verbal consent was obtained.   PROCEDURE: Bilateral Diagnostic Rigid Nasal Endoscopy with Left Debridement Pre-procedure diagnosis: Post-operative examination and care after Left Functional Endoscopic Sinus Surgery - of note: this procedure was NOT performed for management of the septoplasty or the turbinate reduction or mass excision Post-procedure diagnosis: same Indication: See pre-procedure diagnosis and physical exam above Complications: None apparent EBL: 0 mL Anesthesia: Lidocaine  4% and topical decongestant was topically sprayed in each nasal cavity   Description of Procedure:  Patient was identified. A rigid 30 degree endoscope was utilized to evaluate the sinonasal cavities, mucosa, sinus ostia and turbinates and septum.  Overall, signs of mucosal inflammation are noted but improving. Also noted are post-surgical changes with continued crusting within left middle meatus but improving. This was debrided with a 8 Fr suction. After debridement, sinus cavity patency was improved. No adverse synechiae are noted today Right Middle meatus: patent Right SE Recess:  patent Left MM: more patent Left SE Recess: patent  No evidence of papilloma recurrence  CPT CODE -- 31237-LT, 79   A/P: 63 y.o. female with left nasal mass, nasal congestion, nasal septal  deviation and nasal obstruction s/p left FESS and septoplasty.and ITR and nasal mass excision Breathing doing well now Sinus debrided today Continue nasal sprays as pre-op Continue nasal saline rinses daily F/u 3 months  Eldora KATHEE Blanch  [id number], 79

## 2024-07-06 ENCOUNTER — Other Ambulatory Visit: Payer: Self-pay | Admitting: Family Medicine

## 2024-07-06 DIAGNOSIS — E119 Type 2 diabetes mellitus without complications: Secondary | ICD-10-CM

## 2024-07-24 ENCOUNTER — Other Ambulatory Visit: Payer: Self-pay | Admitting: Family Medicine

## 2024-07-24 DIAGNOSIS — E119 Type 2 diabetes mellitus without complications: Secondary | ICD-10-CM

## 2024-07-24 NOTE — Telephone Encounter (Unsigned)
 Copied from CRM #[id number]. Topic: Clinical - Medication Refill >> Jul 24, 2024 11:41 AM Amy B wrote: Medication: metFORMIN  (GLUCOPHAGE -XR) 500 MG 24 hr tablet  Has the patient contacted their pharmacy? Yes (Agent: If no, request that the patient contact the pharmacy for the refill. If patient does not wish to contact the pharmacy document the reason why and proceed with request.) (Agent: If yes, when and what did the pharmacy advise?)  This is the patient's preferred pharmacy:  [facility] [place], [place] - [address] [place] [address] [place] [address] Phone: [phone] Fax: [phone]  Is this the correct pharmacy for this prescription? Yes If no, delete pharmacy and type the correct one.   Has the prescription been filled recently? No  Is the patient out of the medication? No  Has the patient been seen for an appointment in the last year OR does the patient have an upcoming appointment? Yes  Can we respond through MyChart? Yes  Agent: Please be advised that Rx refills may take up to 3 business days. We ask that you follow-up with your pharmacy.

## 2024-07-26 MED ORDER — METFORMIN HCL ER 500 MG PO TB24: 500.0000 mg | ORAL_TABLET | Freq: Every day | ORAL | 1 refills | Status: AC

## 2024-09-25 ENCOUNTER — Ambulatory Visit (INDEPENDENT_AMBULATORY_CARE_PROVIDER_SITE_OTHER): Admitting: Otolaryngology

## 2024-10-09 ENCOUNTER — Encounter: Payer: Self-pay | Admitting: Family Medicine

## 2024-10-09 ENCOUNTER — Ambulatory Visit: Admitting: Family Medicine

## 2024-10-09 VITALS — BP 111/55 | HR 80 | Ht 61.0 in | Wt 144.0 lb

## 2024-10-09 DIAGNOSIS — F5101 Primary insomnia: Secondary | ICD-10-CM | POA: Diagnosis not present

## 2024-10-09 DIAGNOSIS — E119 Type 2 diabetes mellitus without complications: Secondary | ICD-10-CM | POA: Diagnosis not present

## 2024-10-09 DIAGNOSIS — I10 Essential (primary) hypertension: Secondary | ICD-10-CM

## 2024-10-09 LAB — POCT GLYCOSYLATED HEMOGLOBIN (HGB A1C): Hemoglobin A1C: 6.6 % — AB (ref 4.0–5.6)

## 2024-10-09 MED ORDER — TRAZODONE HCL 50 MG PO TABS: 25.0000 mg | ORAL_TABLET | Freq: Every evening | ORAL | 3 refills | Status: AC | PRN

## 2024-10-09 NOTE — Progress Notes (Signed)
 "  Established Patient Office Visit  Patient ID: Kelli Frank, female    DOB: 1961/09/19  Age: 64 y.o. MRN: [id number] PCP: Alvan Dorothyann BIRCH, MD  Chief Complaint  Patient presents with   Diabetes   Hypertension    Subjective:     HPI  Discussed the use of AI scribe software for clinical note transcription with the patient, who gave verbal consent to proceed.  History of Present Illness Kelli Frank is a 64 year old female who presents for follow-up after surgery and to discuss her current medication regimen.  Postoperative recovery and physical activity - Underwent surgery in July with significant limitations in physical activity until September - Since September, increased commitment to health through diet and exercise - Decrease in clothing size despite stable weight - Working out five days per week - Improved breathing and exercise tolerance since becoming smoke-free nearly two years ago  Weight management and pharmacotherapy - Initiated semaglutide two weeks ago at 0.5 mg dose to address weight loss plateau - Discontinued metformin  due to potential interactions with semaglutide - No nausea with semaglutide; monitoring for constipation - Reduced alcohol intake - Mindful of carbohydrate intake, especially around workouts - Staying hydrated  Nutritional status and laboratory findings - Researching nutrition, focusing on protein intake - Learning to manage diet without focusing on the scale - Alkaline phosphatase levels improved in the fall but remain above normal - Previous low protein levels; interested in current status - Taking potassium supplements due to prior low levels; unsure of current potassium status  Respiratory infection - Contracted influenza over Christmas and New Year's while traveling to [place]  - Has not received influenza vaccination this year  Preventive health maintenance - Has a Cologuard kit at home and plans to use it soon - Due for Pap  smear; intends to schedule after upcoming trip to [place]  Travel plans - Planning trip to [place] in February with friends, including one who recently lost her husband     ROS    Objective:     BP (!) 111/55   Pulse 80   Ht 5' 1 (1.549 m)   Wt 144 lb (65.3 kg)   LMP 06/01/2012   SpO2 97%   BMI 27.21 kg/m    Physical Exam Vitals and nursing note reviewed.  Constitutional:      Appearance: Normal appearance.  HENT:     Head: Normocephalic and atraumatic.  Eyes:     Conjunctiva/sclera: Conjunctivae normal.  Cardiovascular:     Rate and Rhythm: Normal rate and regular rhythm.  Pulmonary:     Effort: Pulmonary effort is normal.     Breath sounds: Normal breath sounds.  Skin:    General: Skin is warm and dry.  Neurological:     Mental Status: She is alert.  Psychiatric:        Mood and Affect: Mood normal.      Results for orders placed or performed in visit on 10/09/24  POCT HgB A1C  Result Value Ref Range   Hemoglobin A1C 6.6 (A) 4.0 - 5.6 %   HbA1c POC (<> result, manual entry)     HbA1c, POC (prediabetic range)     HbA1c, POC (controlled diabetic range)        The 10-year ASCVD risk score (Arnett DK, et al., 2019) is: 8.7%    Assessment & Plan:   Problem List Items Addressed This Visit       Cardiovascular and Mediastinum   Primary  hypertension   Blood pressure is well-controlled.  If she continues to lose weight we may need to adjust her Diovan  HCT.       Relevant Orders   CMP14+EGFR   Lipid panel   Hemoglobin A1c     Endocrine   Controlled type 2 diabetes mellitus without complication, without long-term current use of insulin (HCC) - Primary   Relevant Orders   POCT HgB A1C (Completed)   CMP14+EGFR   Lipid panel   Hemoglobin A1c     Other   Insomnia   Relevant Medications   traZODone  (DESYREL ) 50 MG tablet    Assessment and Plan Assessment & Plan Type 2 diabetes mellitus A1c increased to 6.6 after stopping  metformin . No critical concerns with current A1c level. - Ordered confirmatory A1c test with blood work. - Continue semaglutide at 0.5 mg. - Monitor for constipation and manage with stool softeners or Miralax if needed. - Ensure adequate hydration to prevent constipation. - Resume metformin  if A1c increases or if advised by provider.  Obesity Weight loss plateau despite lifestyle modifications. Semaglutide used to overcome plateau, with plans to discontinue after February trip. - Continue semaglutide at 0.5 mg until February. - Monitor weight and dietary intake. - Encouraged regular exercise and healthy eating habits.  Colorectal cancer screening Pending completion of Cologuard test. Kit expiration date needs verification. - Verify expiration date of Cologuard kit. - Complete Cologuard test and send sample if kit is not expired.  Cervical cancer screening Due for Pap smear as of November 2024. No recent Pap smear performed. - Schedule Pap smear after February trip. - Perform co-testing if Pap smear results are normal.    Return in about 3 months (around 01/07/2025) for Wellness Exam/Pap Smear .    Dorothyann Byars, MD [facility] at [facility]   "

## 2024-10-09 NOTE — Assessment & Plan Note (Signed)
 Blood pressure is well-controlled.  If she continues to lose weight we may need to adjust her Diovan  HCT.

## 2024-10-10 ENCOUNTER — Encounter: Payer: Self-pay | Admitting: Family Medicine

## 2024-10-10 ENCOUNTER — Ambulatory Visit: Payer: Self-pay | Admitting: Family Medicine

## 2024-10-10 LAB — LIPID PANEL
Chol/HDL Ratio: 5.3 ratio — ABNORMAL HIGH (ref 0.0–4.4)
Cholesterol, Total: 288 mg/dL — ABNORMAL HIGH (ref 100–199)
HDL: 54 mg/dL
LDL Chol Calc (NIH): 197 mg/dL — ABNORMAL HIGH (ref 0–99)
Triglycerides: 193 mg/dL — ABNORMAL HIGH (ref 0–149)
VLDL Cholesterol Cal: 37 mg/dL (ref 5–40)

## 2024-10-10 LAB — CMP14+EGFR
ALT: 19 IU/L (ref 0–32)
AST: 21 IU/L (ref 0–40)
Albumin: 4.4 g/dL (ref 3.9–4.9)
Alkaline Phosphatase: 127 IU/L (ref 49–135)
BUN/Creatinine Ratio: 27 (ref 12–28)
BUN: 26 mg/dL (ref 8–27)
Bilirubin Total: 0.3 mg/dL (ref 0.0–1.2)
CO2: 24 mmol/L (ref 20–29)
Calcium: 9.9 mg/dL (ref 8.7–10.3)
Chloride: 101 mmol/L (ref 96–106)
Creatinine, Ser: 0.96 mg/dL (ref 0.57–1.00)
Globulin, Total: 2.3 g/dL (ref 1.5–4.5)
Glucose: 96 mg/dL (ref 70–99)
Potassium: 3.9 mmol/L (ref 3.5–5.2)
Sodium: 140 mmol/L (ref 134–144)
Total Protein: 6.7 g/dL (ref 6.0–8.5)
eGFR: 67 mL/min/1.73

## 2024-10-10 LAB — HEMOGLOBIN A1C
Est. average glucose Bld gHb Est-mCnc: 146 mg/dL
Hgb A1c MFr Bld: 6.7 % — ABNORMAL HIGH (ref 4.8–5.6)

## 2024-10-10 NOTE — Progress Notes (Signed)
 Hi Kelli Frank, total cholesterol and LDL are still significantly elevated.  I would highly recommend a statin I know you have tried atorvastatin  in the past and did not do well.  Would you be willing to try a different statin? A1c 6.7, so it was right around what we were getting on the fingerstick.  Overall it is stable though so I definitely want you to continue with the good changes that you have made.  Liver enzymes overall look good and kidney function is stable.

## 2024-11-08 ENCOUNTER — Ambulatory Visit (INDEPENDENT_AMBULATORY_CARE_PROVIDER_SITE_OTHER): Admitting: Otolaryngology

## 2025-01-08 ENCOUNTER — Encounter: Admitting: Family Medicine
